# Patient Record
Sex: Male | Born: 2000 | Race: Black or African American | Hispanic: No | Marital: Single | Smoking: Current every day smoker
Health system: Southern US, Community
[De-identification: ages and names within clinical notes are randomized; demographics above are authoritative.]

## PROBLEM LIST (undated history)

## (undated) DIAGNOSIS — S8291XA Unspecified fracture of right lower leg, initial encounter for closed fracture: Secondary | ICD-10-CM

## (undated) DIAGNOSIS — S8292XA Unspecified fracture of left lower leg, initial encounter for closed fracture: Secondary | ICD-10-CM

## (undated) DIAGNOSIS — F909 Attention-deficit hyperactivity disorder, unspecified type: Secondary | ICD-10-CM

---
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
## 2022-06-19 ENCOUNTER — Other Ambulatory Visit: Payer: Self-pay

## 2022-06-19 ENCOUNTER — Emergency Department (HOSPITAL_COMMUNITY)
Admission: EM | Admit: 2022-06-19 | Discharge: 2022-06-20 | Disposition: A | Discharge status: Home / Self Care | Payer: Medicaid Other | Attending: Emergency Medicine | Admitting: Emergency Medicine

## 2022-06-19 DIAGNOSIS — R109 Unspecified abdominal pain: Secondary | ICD-10-CM | POA: Insufficient documentation

## 2022-06-19 DIAGNOSIS — R45851 Suicidal ideations: Secondary | ICD-10-CM

## 2022-06-19 DIAGNOSIS — Z1152 Encounter for screening for COVID-19: Secondary | ICD-10-CM | POA: Diagnosis not present

## 2022-06-19 DIAGNOSIS — F909 Attention-deficit hyperactivity disorder, unspecified type: Secondary | ICD-10-CM | POA: Diagnosis not present

## 2022-06-19 DIAGNOSIS — F4323 Adjustment disorder with mixed anxiety and depressed mood: Secondary | ICD-10-CM | POA: Insufficient documentation

## 2022-06-19 LAB — CBC WITH DIFFERENTIAL/PLATELET
Abs Immature Granulocytes: 0.02 10*3/uL (ref 0.00–0.07)
Basophils Absolute: 0 10*3/uL (ref 0.0–0.1)
Basophils Relative: 1 %
Eosinophils Absolute: 0.2 10*3/uL (ref 0.0–0.5)
Eosinophils Relative: 3 %
HCT: 40.4 % (ref 39.0–52.0)
Hemoglobin: 13.3 g/dL (ref 13.0–17.0)
Immature Granulocytes: 0 %
Lymphocytes Relative: 39 %
Lymphs Abs: 2.3 10*3/uL (ref 0.7–4.0)
MCH: 30.4 pg (ref 26.0–34.0)
MCHC: 32.9 g/dL (ref 30.0–36.0)
MCV: 92.4 fL (ref 80.0–100.0)
Monocytes Absolute: 0.5 10*3/uL (ref 0.1–1.0)
Monocytes Relative: 8 %
Neutro Abs: 3 10*3/uL (ref 1.7–7.7)
Neutrophils Relative %: 49 %
Platelets: 248 10*3/uL (ref 150–400)
RBC: 4.37 MIL/uL (ref 4.22–5.81)
RDW: 12.1 % (ref 11.5–15.5)
WBC: 6 10*3/uL (ref 4.0–10.5)
nRBC: 0 % (ref 0.0–0.2)

## 2022-06-19 LAB — RAPID URINE DRUG SCREEN, HOSP PERFORMED
Amphetamines: NOT DETECTED
Barbiturates: NOT DETECTED
Benzodiazepines: NOT DETECTED
Cocaine: NOT DETECTED
Opiates: NOT DETECTED
Tetrahydrocannabinol: POSITIVE — AB

## 2022-06-19 LAB — ETHANOL: Alcohol, Ethyl (B): <10 mg/dL (ref <10)

## 2022-06-19 LAB — RESP PANEL BY RT-PCR (RSV, FLU A&B, COVID)  RVPGX2
Influenza A by PCR: NEGATIVE
Influenza B by PCR: NEGATIVE
Resp Syncytial Virus by PCR: NEGATIVE
SARS Coronavirus 2 by RT PCR: NEGATIVE

## 2022-06-19 LAB — COMPREHENSIVE METABOLIC PANEL
ALT: 12 U/L (ref 0–44)
AST: 19 U/L (ref 15–41)
Albumin: 4.1 g/dL (ref 3.5–5.0)
Alkaline Phosphatase: 89 U/L (ref 38–126)
Anion gap: 7 (ref 5–15)
BUN: 6 mg/dL (ref 6–20)
CO2: 28 mmol/L (ref 22–32)
Calcium: 9.5 mg/dL (ref 8.9–10.3)
Chloride: 102 mmol/L (ref 98–111)
Creatinine, Ser: 0.75 mg/dL (ref 0.61–1.24)
GFR, Estimated: >60 mL/min (ref >60)
Glucose, Bld: 101 mg/dL — ABNORMAL HIGH (ref 70–99)
Potassium: 3.5 mmol/L (ref 3.5–5.1)
Sodium: 137 mmol/L (ref 135–145)
Total Bilirubin: 1.3 mg/dL — ABNORMAL HIGH (ref 0.3–1.2)
Total Protein: 6.9 g/dL (ref 6.5–8.1)

## 2022-06-19 LAB — LACTIC ACID, PLASMA: Lactic Acid, Venous: 0.9 mmol/L (ref 0.5–1.9)

## 2022-06-19 MED ORDER — ACETAMINOPHEN 325 MG PO TABS: 650.0000 mg | ORAL_TABLET | ORAL | Status: DC | PRN

## 2022-06-19 MED ORDER — ZOLPIDEM TARTRATE 5 MG PO TABS: 5.0000 mg | ORAL_TABLET | Freq: Every evening | ORAL | Status: DC | PRN

## 2022-06-19 MED ORDER — ALUM & MAG HYDROXIDE-SIMETH 200-200-20 MG/5ML PO SUSP: 30.0000 mL | Freq: Four times a day (QID) | ORAL | Status: DC | PRN

## 2022-06-19 MED ORDER — NICOTINE 21 MG/24HR TD PT24
21.0000 mg | MEDICATED_PATCH | Freq: Every day | TRANSDERMAL | Status: DC
  Filled 2022-06-19: qty 1

## 2022-06-19 MED ORDER — ONDANSETRON HCL 4 MG PO TABS: 4.0000 mg | ORAL_TABLET | Freq: Three times a day (TID) | ORAL | Status: DC | PRN

## 2022-06-19 NOTE — ED Triage Notes (Signed)
Pt bib ems with reports of congestion, headache, body aches.

## 2022-06-19 NOTE — ED Provider Triage Note (Signed)
Emergency Medicine Provider Triage Evaluation Note  Robert Silva , a 22 y.o. male  was evaluated in triage.  Pt complains of headache, body aches, runny nose, sore throat. Reportedly febrile at home with decreased level of consciousness. Periumbilical abdominal discomfort. Nausea with vomiting, soft stools. Patient endorsed to nursing staff that he was suicidal, would attempt to hurt himself  Review of Systems  Positive: Abdominal pain, URI symptoms, fever Negative: Neck pain  Physical Exam  BP 139/64   Pulse 62   Temp 99 F (37.2 C) (Oral)   Resp 16   SpO2 98%  Gen:   Awake, no distress   Resp:  Normal effort  MSK:   Moves extremities without difficulty  Other:  Diffuse abdominal discomfort  Medical Decision Making  Medically screening exam initiated at 7:35 PM.  Appropriate orders placed.  Robert Silva was informed that the remainder of the evaluation will be completed by another provider, this initial triage assessment does not replace that evaluation, and the importance of remaining in the ED until their evaluation is complete.     Etta Quill, NP 06/19/22 1947
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
## 2022-06-19 NOTE — ED Notes (Signed)
During triage pt expressed SI and states that he recently lost a mother figure and that the father of the love of his life hates him and that his biological mother tells him he was a mistake. Pt states multiple plans to end life including jumping off a a bridge or slitting his wrists.

## 2022-06-20 ENCOUNTER — Other Ambulatory Visit: Payer: Self-pay

## 2022-06-20 ENCOUNTER — Encounter (HOSPITAL_COMMUNITY): Payer: Self-pay

## 2022-06-20 DIAGNOSIS — F4323 Adjustment disorder with mixed anxiety and depressed mood: Secondary | ICD-10-CM

## 2022-06-20 DIAGNOSIS — F909 Attention-deficit hyperactivity disorder, unspecified type: Secondary | ICD-10-CM

## 2022-06-20 NOTE — ED Notes (Signed)
Resting quietly. No complaints.
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
## 2022-06-20 NOTE — ED Notes (Signed)
Tolerated lunch tray. No complaints. Awaiting discharge.

## 2022-06-20 NOTE — ED Notes (Signed)
Pt is not ivc for now voluntary form attached to the clipboard in orange zone

## 2022-06-20 NOTE — ED Notes (Signed)
Pt resting with eyes closed; respirations spontaneous, even, unlabored 

## 2022-06-20 NOTE — ED Notes (Signed)
Pt ambulatory to and from restroom with steady gait 

## 2022-06-20 NOTE — Discharge Instructions (Signed)
[facility]  [address] [place], [place] [address] [phone]

## 2022-06-20 NOTE — ED Notes (Signed)
Cooperative. Denies S.I. No complaints. Ambulatory with steady gait. Independent with ADL.

## 2022-06-21 ENCOUNTER — Encounter (HOSPITAL_COMMUNITY): Payer: Self-pay | Admitting: Registered Nurse

## 2022-06-21 ENCOUNTER — Ambulatory Visit (HOSPITAL_COMMUNITY)
Admission: EM | Admit: 2022-06-21 | Discharge: 2022-06-21 | Disposition: A | Discharge status: Home / Self Care | Payer: Medicaid Other | Attending: Registered Nurse | Admitting: Registered Nurse

## 2022-06-21 DIAGNOSIS — Z599 Problem related to housing and economic circumstances, unspecified: Secondary | ICD-10-CM

## 2022-06-21 DIAGNOSIS — Z5900 Homelessness unspecified: Secondary | ICD-10-CM

## 2022-06-21 DIAGNOSIS — F4323 Adjustment disorder with mixed anxiety and depressed mood: Secondary | ICD-10-CM | POA: Insufficient documentation

## 2022-06-21 NOTE — Discharge Instructions (Addendum)
[facility]: Outpatient psychiatric Services  New Patient Assessment and Therapy Walk-in Monday thru Thursday 8:00 am first come first serve until slots are full Every Friday from 1:00 pm to 4:00 pm first come first serve until slots are full  New Patient Psychiatric Medication Management Monday thru Friday from 8:00 am to 11:00 am first come first served until slots are full  For all walk-ins we ask that you arrive by 7:15 am because patients will be seen in there order of arrival.   Availability is limited, and therefore you may not be seen on the same day that you walk in.  Our goal is to serve and meet the needs of our community to the best of our ability.   [facility] Address: [address] [place], [place], [place] [address] Phone: [phone]  Supported Employment The supported employment program is a person-centered, individualized, evidence-based support service that helps members choose, acquire, and maintain competitive employment in our community. This service supports the varying needs of individuals and promotes community inclusion and employment success. Members enrolled in the supported employment program can expect the following:  Development of an individual career plan Community based job placement Job shadowing Job development On-site job Furniture conservator/restorer and support  Supported Education Supported education helps our members receive the education and training they need to achieve their learning and recovery goals. This will assist members with becoming gainfully employed in the job or career of their choice. The program includes assistance with: Registering for disability accommodations Enrolling in school and registering for classes Learning communication skills Scheduling tutoring sessions within your school [facility] partners with [employer] to help increase the success of clients seeking employment and  educational goals.  Want to learn more about our programs?   Please contact our intake department INTAKE: [phone] Ext [phone]  Mailing: [address] [address]   [place], [place] [address]   [email or web address]   Suicide Resources  Who to Call Call 911 National Suicide Prevention Hotline - Dial 988   [facility] at (424)446-5283; 847-421-2098  More Resources Suicide Awareness Voices of Education       [phone]        www.save.org [employer] on Mental Illness(NAMI)       (800) 950-NAMI        www.nami.org American Association of Suicidology       212-571-7463        www.suicidology.org  Substance Abuse Treatment Programs  Intensive Outpatient Programs [facility]     [address]      [place], [place]                   [phone]       [facility] [address] [place] [address] [place], [place] [phone]  [facility]     (Inpatient and outpatient)     [address]           [phone]    [facility] [phone] (Suboxone and Methadone)  [address]      [place], [place] [address]      [phone]       [address] [place], [place] [phone]  [facility] (Outpatient/Inpatient, Chemical)    (insurance only) [phone]             [facility] (Groups & Residential) [place], [place] [phone]  [facility]     [address]     [place], [place]      [phone]       [facility] (for caregivers and family) [phone] [address] [place], [place] [phone]      Residential Treatment Programs [facility]      [address] [place], [place] [address]  [phone]       [facility] [address] [place], [place] [address] [phone]  [facility] [place]        [phone]       [facility] [phone]  [facility] (Addiction Recovery Care [facility]             [address]                                         [place], [place]                                                [phone] or [phone]                               [facility] [address] [place], [address] [phone]  [facility]    [address]      [place], [place]     [phone]       [facility] [address] [place], [place] [phone]  [facility]   [address] [place], [place] [address]     [phone]      Admissions: 8am-3pm M-F  [facility] (RTS) [address] [place], [place] [phone]  [facility]: Residential Program [phone] Days)   [place], [place]      [phone] or [phone]     [facility]: [facility] [place], [place] (Walk in Hours over the weekend or by referral)  [facility] [address] [place], [place], [place] [address] [phone]  Crisis Mobile: [facility]:  [phone] (for crisis response 24 hours a day) [facility] Hotline:      628-666-3827 Outpatient Psychiatry and Counseling  [facility]: Mobile Crisis Management 24 hours:  [phone]  [facility] [employer] sliding scale fee and walk in schedule: M-F 8am-12pm/1pm-3pm [address]  [place], [place] [address] [phone]  [facility] [address] [place], [place] [address] [phone]  [facility] (Former[facility] [employer])- new patient walk-in appointments available Monday - Friday 8am -3pm.          [address] [place], [place] [address] [phone] or crisis line- [phone]  [facility] Services/ Intensive Outpatient Therapy Program [address] [place], [place] [address] [phone]  [facility]                  Crisis Services      [phone] [address]     [place], [place] [address]                 [facility]   [facility] [phone]. [place]  [address] [place], [place] [address]   [facility]          [address] [place]  [place], [place] [address]       [phone]  [facility] [address] [place], [place] [address] [phone]  [facility]    [address]    [place], [place] [address]     [phone]       Andee Poles, MD     [address]     [place] [address]     [phone]       [facility] [address] [place] [address]  [facility]     [address] [place], [place]      [phone]       [facility] Eulogio Ditch, MD [address] [place], [place] [address] [phone]  [facility]     [address]     [place], [place] [address]     [phone]       [facility] [address] [place], [place] [address] [phone] Resources for Temporary Residential Assistance/Crisis Centers  DAY CENTERS [facility]) M-F 8am-3pm   [address] [place], [place] [address]   [phone] Services include: laundry, barbering, support groups, case management, phone  & computer access, showers, AA/NA mtgs, mental health/substance abuse nurse, job skills class, disability information, VA assistance, spiritual classes, etc.   HOMELESS SHELTERS  [facility]     [facility]   [address], [place]     [phone]              [employer] (women and children)       [address] [place], [place] [address] [phone] [email or web address] for application and process Application Required  [facility]   [address]    [place] [address]     [phone]                    [facility] [address] [place], [place] [address] [phone] application appt.) Application Required  [facility] (women only)    [address]     [place], [place] [address]     [phone]      Intake starts 6pm daily Need valid ID, SSC, & Police report [employer] [address] [place], [place] [phone] Application Required  [employer] (men only)     [address]      [place], [place]     [phone]       [facility] [place] (Pregnant women only) [address] [place], [place] [phone]  [facility]      [address]      [place], [place] [address]     [phone]             [facility] [address] [place], [place] [phone] 90 day commitment/SA/Application process  [facility](men only)     [address]     [place], [place]     [phone]       Check-in at [facility] of [place]  [place], [place] [address] [phone] Men/Women/Women and Children must be there by 7 pm  [place], [place]                          Care Coordination What is Care Coordination? Care Coordination is a part of [facility]'s managed care system. It is designed to ensure the best of care for people with serious treatment needs.  When consumers' needs put them in high-risk situations (such as relapse, hospitalization, homelessness, incarceration and/or transition between services), Care Coordinators take special care to link them with the specialty services they need.  Care Coordination is focused on helping individuals across the entire healthcare system. Care Coordinators manage consumers' care from assessment to various treatments and services.  By working with the individual and their providers, Care Coordinators improve results by effectively  utilizing resources.  What Are Special Needs Populations?  Each disability area has a Special Needs Population that is considered to be at high-risk and is served through [facility]. Intellectual Developmental Disabilities (I/DD): People enrolled in the [place] who receive B3 Deinstitutionalization (DI) funding People with I/DD who are functionally eligible for ICF-MR level of care but are not enrolled in [facility] [employer], the B3 DI funding or an ICF-MR facility. People with I/DD who are currently in, or have been within the past 30 days, a facility operated by the Department of Corrections [facility]) or the Department of [place] and Delinquency (DJJDP) for whom the [facility] has received notification of discharge.  Mental Health/Substance Abuse (MH/SA): Adults with severe and persistent mental illness and current LOCUS (Level of Care Utilization System) Level of VI Children with Severe Emotional Disturbance or current CALOCUS (Child and Adolescent Level of Care Utilization System) level of IV or are currently in, or have been within the past 30 days, a facility operated by the Department of Corrections [facility]) or the Department of Juvenile Justice and Delinquency (DJJDP) for whom the [facility] has received notification of discharge. Individuals that have Substance Use/Addiction Dependence diagnosis and current ASAM Level of lll.7 or ll.2D or higher. Individuals with an opioid dependence diagnosis and who have been reported to have used drugs by injection within the past 30 days. Individuals with both a mental illness diagnosis and a substance abuse/addiction diagnosis and current LOCUS/CALOCUS of V or higher OR current ASAM PPC Level of lll.5 or higher Individuals with both a mental illness diagnosis and a developmental disability diagnosis and current LOCUS/CALOCUS of IV or higher Individuals with both a developmental disability diagnosis and a substance use/addiction  diagnosis and current ASAM PPC Level of lll.3 or higher  What do Care Coordinators Do?  Care Coordination includes the following: Identification of Special Needs Population individuals Ensuring that a Person Centered Plan is available for all individuals with special needs For individuals in identified MH/SA special health populations, ensuring that a Person Centered Plan is completed by the [place] engaging individuals identified as a member of a special needs population Identifying the gaps in needed services and intervening to ensure the person receives appropriate care Coordinating services for the individual across the system and with other systems of care, including primary care Measuring results of intervention and treatment, including reduction in high-risk events and inappropriate service utilization Collaboration with [place] to assure that consumers medical care is considered in accordance with CCNC/LME/MCO protocols  What can you expect in the  [employer] Plan of Care process? During the planning process, your Care Coordinator will explain the different services to you and work with you to develop your Plan of Care based on the services you wish to request. Your Care Coordinator will also explain the requirements in the [employer] Waiver around those services. Your Care Coordinator will assure that your Plan of Care will include the services that you want to request, for the length of time that you want to request them. Your Plan of Care should be used to plan for the entire year, and services that you expect to need at any point during that year. If you expect to need services for the entire year, your Care Coordinator will assure that the plan requests those services for the entire year. You must have a signed Plan of Care in order to receive services through the [employer] Waiver. That means that you need to sign a Plan containing  the level of services that you want to request, which may be different than the level of services that will be approved. Your Care Coordinator will draft the Plan of Care based on your wishes, will review the plan with you before you sign it, will answer any questions you have, and will make any changes to the plan that you request before you are asked to sign it. If you wish to change or add services during the plan year, you may ask your Care Coordinator to help you request the change by writing an update to your Plan of Care at any time. You (or your legally responsible representative) will need to sign the Plan of Care once it is complete. You will not be asked to sign a plan that does not contain the level of services that you want to request. If you expect to need those services all year, you will not be asked to sign a plan that does not request those services for the entire plan year. The Utilization Management Department of [facility] will determine whether or not the services you request are medically necessary, not your Care Coordinator. A decision on your request for services in your Pan of Care will be made within 15 days unless more information is needed. If any service requested in your Plan of Care is not fully approved (for example, a service is denied or is approved for fewer hours or for a length of time that is less than what you requested), you will receive a written explanation of that decision and information about how you can appeal. [facility] will not retaliate against you in any way if you appeal. Your Care Coordinator can assist you with the forms needed to file an appeal. If some services are approved and some are denied, you can receive the services that were approved while you appeal the services that were denied. You may also make a new request for different services while your appeal is pending, if you wish to do so. Your Plan of Care will include information on  the period of time for which services are requested. If services that have been requested in your Plan have been approved and then are later reduced, suspended, or terminated before the approval period has ended, and you appeal that decision, you may be able to continue to receive services during an appeal. You will receive written notice about that process before any services are reduced, suspended or terminated.  How to Seek Help Call [phone] any time, day or night, to speak with  a licensed behavioral health clinician for information or to schedule an assessment appointment regarding mental health, intellectual/developmental disabilities or substance use disorders. Get emergency and non-emergency assistance, answers to your questions and/or an appointment with a provider in [place].  When needed, interpreter services will be arranged through our Call Center or through your provider at no cost to you.  For people who have hearing impairments and/or speech disabilities, please call [phone], or [phone].  Si alguien est experimentando una crisis de salud conductual, comunquese con [place] de crisis de salud conductual sin cargo al [phone]. Si una persona parece estar en peligro inminente para s misma, para usted o para Producer, television/film/video, llame de inmediato al 911.

## 2022-06-21 NOTE — ED Provider Notes (Cosign Needed Addendum)
Behavioral Health Urgent Care Medical Screening Exam  Patient Name: Robert Silva MRN: [id number] Date of Evaluation: 06/21/22 Chief Complaint:   Psychiatric services, community resources Diagnosis:  Final diagnoses:  Homelessness  Adjustment disorder with mixed anxiety and depressed mood  Inadequate community resources    History of Present illness: Robert Silva is a 22 y.o. male patient presented to [facility] as a walk in via [place] requesting outpatient psychiatric services.    Robert Silva, 22 y.o., male patient seen face to face by this provider, consulted with Dr. Hampton Abbot; and chart reviewed on 06/21/22.  On evaluation Robert Silva reports he came to get set up with outpatient psychiatric services.  Patient states he is currently homeless and has been staying at the [facility] because of the white flag nights and sometimes he has a friend that "will let me stay with him.  He said I could stay with him tonight, but I got to meet him at the [facility]).  Patient endorses depression related to his homelessness, unemployment, not getting along with the mother of one of his children."  States he has plans to ask his second baby mama to marry him as soon as he gets his self together.  Patient denies active suicidal ideation, self-harm and homicidal ideation.  Patient also denies psychosis and paranoia.  Reports he was discharged from the emergency room yesterday and referred to come her for psychiatric services.  Informed he needed to come during the day hours.  Understanding voiced.   During evaluation:  Robert Silva is sitting in chair with no noted distress.  He is dressed appropriate for weather.  He is alert/oriented x 4, calm, cooperative, pleasant, attentive, and responses are relevant to assessment questions.  His mood is euthymic but anxious with congruent affect.  He is speaking in a clear tone at moderate volume, and normal pace, with good eye contact.  He  denies suicidal/self-harm/homicidal ideation, psychosis, and paranoia.  Objectively:  there is no evidence of psychosis/mania or delusional thinking.  He is able to converse coherently, with goal directed thoughts, and no distractibility, or pre-occupation.    At this time Robert Silva is educated and verbalizes understanding of mental health resources and other crisis services in the community. He is instructed to call 911 and present to the nearest emergency room should he experience any suicidal/homicidal ideation, auditory/visual/hallucinations, or detrimental worsening of his mental health condition.  He was a also advised by Probation officer that he could call the toll-free phone on back of  insurance card to assist with identifying counselors and agencies in network number on back of Medicaid card to speak with care coordinator.    [facility] from 06/21/2022 in [facility] ED from 06/19/2022 in [facility] Emergency Department at [facility] ED from 10/17/2021 in [facility] Emergency Department at [place] CATEGORY Moderate Risk High Risk No Risk       Psychiatric Specialty Exam  Presentation  General Appearance:Appropriate for Environment  Eye Contact:Good  Speech:Clear and Coherent; Normal Rate  Speech Volume:Normal  Handedness:Right   Mood and Affect  Mood: Euthymic  Affect: Appropriate; Congruent   Thought Process  Thought Processes: Coherent; Goal Directed  Descriptions of Associations:Intact  Orientation:Full (Time, Place and Person)  Thought Content:Logical    Hallucinations:None  Ideas of Reference:None  Suicidal Thoughts:No Without Intent; Without Plan  Homicidal Thoughts:No   Sensorium  Memory: Immediate Good; Recent Good  Judgment:  Intact  Insight: Present; Fair   Community education officer  Concentration: Fair  Attention Span: Fair  Recall: Robert Silva of  Knowledge: Good  Language: Good   Psychomotor Activity  Psychomotor Activity: Normal   Assets  Assets: Communication Skills; Desire for Improvement; Physical Health; Social Support   Sleep  Sleep: Fair  Number of hours:  6   Physical Exam: Physical Exam Vitals and nursing note reviewed.  Constitutional:      General: He is not in acute distress.    Appearance: Normal appearance. He is not ill-appearing.  HENT:     Head: Normocephalic.  Eyes:     Conjunctiva/sclera: Conjunctivae normal.  Cardiovascular:     Rate and Rhythm: Normal rate.  Pulmonary:     Effort: Pulmonary effort is normal.  Musculoskeletal:        General: Normal range of motion.     Cervical back: Normal range of motion.  Skin:    General: Skin is warm and dry.  Neurological:     Mental Status: He is alert and oriented to person, place, and time.  Psychiatric:        Attention and Perception: Attention and perception normal. He does not perceive auditory or visual hallucinations.        Mood and Affect: Mood and affect normal.        Behavior: Behavior normal. Behavior is cooperative.        Thought Content: Thought content normal. Thought content is not paranoid or delusional. Thought content does not include homicidal or suicidal ideation.        Judgment: Judgment is impulsive.    Review of Systems  Constitutional: Negative.   HENT: Negative.    Eyes: Negative.   Respiratory: Negative.    Cardiovascular: Negative.   Gastrointestinal: Negative.   Genitourinary: Negative.   Musculoskeletal: Negative.   Skin: Negative.   Neurological: Negative.   Endo/Heme/Allergies: Negative.   Psychiatric/Behavioral:  Depression: Stable. Hallucinations: Denies. Suicidal ideas: Denies. Nervous/anxious: Stable.    Blood pressure (!) 143/82, pulse 80, temperature 98.6 F (37 C), temperature source Oral, resp. rate 18, SpO2 100 %. There is no height or weight on file to calculate  BMI.  Musculoskeletal: Strength & Muscle Tone: within normal limits Gait & Station: normal Patient leans: N/A   [place] MSE Discharge Disposition for Follow up and Recommendations: Based on my evaluation the patient does not appear to have an emergency medical condition and can be discharged with resources and follow up care in outpatient services for Medication Management and Individual Therapy   Nikeya Maxim, NP 06/21/2022, 7:33 PM
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
## 2022-07-02 ENCOUNTER — Emergency Department (HOSPITAL_COMMUNITY)
Admission: EM | Admit: 2022-07-02 | Discharge: 2022-07-02 | Disposition: A | Discharge status: Home / Self Care | Payer: Medicaid Other | Attending: Emergency Medicine | Admitting: Emergency Medicine

## 2022-07-02 ENCOUNTER — Other Ambulatory Visit: Payer: Self-pay

## 2022-07-02 DIAGNOSIS — J039 Acute tonsillitis, unspecified: Secondary | ICD-10-CM | POA: Diagnosis not present

## 2022-07-02 DIAGNOSIS — R590 Localized enlarged lymph nodes: Secondary | ICD-10-CM | POA: Insufficient documentation

## 2022-07-02 DIAGNOSIS — J029 Acute pharyngitis, unspecified: Secondary | ICD-10-CM | POA: Diagnosis present

## 2022-07-02 MED ORDER — AMOXICILLIN 500 MG PO CAPS
500.0000 mg | ORAL_CAPSULE | Freq: Once | ORAL | Status: AC
  Administered 2022-07-02: 500 mg via ORAL
  Filled 2022-07-02: qty 1

## 2022-07-02 MED ORDER — AMOXICILLIN 500 MG PO CAPS: 500.0000 mg | ORAL_CAPSULE | Freq: Three times a day (TID) | ORAL | 0 refills | Status: AC

## 2022-07-02 NOTE — ED Triage Notes (Signed)
Pt arrives to ED c/o throat pain x 7 days, pt endorses that children have strep throat.

## 2022-07-02 NOTE — Discharge Instructions (Addendum)
Gargle warm salt water, drink warm tea with honey.  Take antibiotics as prescribed and complete the full course. Recheck with your doctor if not improving in the next 2 to 3 days.

## 2022-07-02 NOTE — ED Provider Notes (Signed)
[facility] Provider Note   CSN: [id number] Arrival date & time: 07/02/22  2315     History {Add pertinent medical, surgical, social history, OB history to HPI:1} Chief Complaint  Patient presents with  . Sore Throat    Robert Silva is a 22 y.o. male.  22 year old male brought in by EMS with complaint of sore throat x 1 week.  Patient has not take anything for his symptoms.  Was exposed to his son who has tested positive for strep.  He denies cough, congestion, body aches or fevers.      Home Medications Prior to Admission medications   Medication Sig Start Date End Date Taking? Authorizing Provider  amoxicillin (AMOXIL) 500 MG capsule Take 1 capsule (500 mg total) by mouth 3 (three) times daily for 10 days. 07/02/22 07/12/22 Yes Tacy Learn, PA-C      Allergies    Fava beans and Sulfa antibiotics    Review of Systems   Review of Systems Negative except as per HPI Physical Exam Updated Vital Signs BP 132/76   Pulse 73   Temp 98.8 F (37.1 C)   Resp 17   SpO2 98%  Physical Exam Vitals and nursing note reviewed.  Constitutional:      General: He is not in acute distress.    Appearance: He is well-developed. He is not diaphoretic.  HENT:     Head: Normocephalic and atraumatic.     Nose: No congestion.     Mouth/Throat:     Mouth: Mucous membranes are moist.     Pharynx: Oropharynx is clear. Uvula midline. No pharyngeal swelling, oropharyngeal exudate, posterior oropharyngeal erythema or uvula swelling.     Tonsils: Tonsillar exudate present. No tonsillar abscesses. 1+ on the right. 1+ on the left.  Eyes:     Conjunctiva/sclera: Conjunctivae normal.  Cardiovascular:     Rate and Rhythm: Normal rate and regular rhythm.     Heart sounds: Normal heart sounds.  Pulmonary:     Effort: Pulmonary effort is normal.     Breath sounds: Normal breath sounds.  Lymphadenopathy:     Cervical: Cervical adenopathy present.   Skin:    General: Skin is warm and dry.  Neurological:     Mental Status: He is alert and oriented to person, place, and time.  Psychiatric:        Behavior: Behavior normal.    ED Results / Procedures / Treatments   Labs (all labs ordered are listed, but only abnormal results are displayed) Labs Reviewed - No data to display  EKG None  Radiology No results found.  Procedures Procedures  {Document cardiac monitor, telemetry assessment procedure when appropriate:1}  Medications Ordered in ED Medications  amoxicillin (AMOXIL) capsule 500 mg (has no administration in time range)    ED Course/ Medical Decision Making/ A&P   {   Click here for ABCD2, HEART and other calculatorsREFRESH Note before signing :1}                          Medical Decision Making  22 year old male with complaint of sore throat x 1 week after exposure to his child who has tested positive for strep.  Found to have enlarged tonsils with light exudate and tender cervical adenopathy.  Uvula is midline, do not suspect tonsillar abscess.  Lungs clear to auscultation.  Patient is provided with first dose of amoxicillin, prescription for same sent to  his pharmacy.  {Document critical care time when appropriate:1} {Document review of labs and clinical decision tools ie heart score, Chads2Vasc2 etc:1}  {Document your independent review of radiology images, and any outside records:1} {Document your discussion with family members, caretakers, and with consultants:1} {Document social determinants of health affecting pt's care:1} {Document your decision making why or why not admission, treatments were needed:1} Final Clinical Impression(s) / ED Diagnoses Final diagnoses:  Tonsillitis    Rx / DC Orders ED Discharge Orders          Ordered    amoxicillin (AMOXIL) 500 MG capsule  3 times daily        07/02/22 2321

## 2022-08-06 ENCOUNTER — Encounter: Payer: Self-pay | Admitting: Pediatric Intensive Care

## 2022-08-06 NOTE — Progress Notes (Signed)
No needs at this point. Lisette Abu BSN RN CCNP [phone]

## 2022-08-06 NOTE — Progress Notes (Signed)
This patient was identified as a resident of [facility]. Resources provided

## 2022-08-12 ENCOUNTER — Encounter: Payer: Self-pay | Admitting: *Deleted

## 2022-08-12 NOTE — Progress Notes (Signed)
Pt attended the 08/06/22 screening event during which his b/p was within normal limits. Per the event RN and CHW, th pt identified as a resident of [place]. Resources provided for identified SDOH barriers and pt has access to [facility] PCP and CCNP if needed. No additional health equity team support indicated at this time.

## 2022-09-06 ENCOUNTER — Emergency Department (HOSPITAL_COMMUNITY): Payer: Medicaid Other

## 2022-09-06 ENCOUNTER — Emergency Department (HOSPITAL_COMMUNITY)
Admission: EM | Admit: 2022-09-06 | Discharge: 2022-09-06 | Disposition: A | Discharge status: Home / Self Care | Payer: Medicaid Other | Attending: Emergency Medicine | Admitting: Emergency Medicine

## 2022-09-06 ENCOUNTER — Other Ambulatory Visit: Payer: Self-pay

## 2022-09-06 ENCOUNTER — Encounter (HOSPITAL_COMMUNITY): Payer: Self-pay

## 2022-09-06 DIAGNOSIS — W25XXXA Contact with sharp glass, initial encounter: Secondary | ICD-10-CM | POA: Insufficient documentation

## 2022-09-06 DIAGNOSIS — S60413A Abrasion of left middle finger, initial encounter: Secondary | ICD-10-CM | POA: Insufficient documentation

## 2022-09-06 DIAGNOSIS — M79642 Pain in left hand: Secondary | ICD-10-CM | POA: Diagnosis present

## 2022-09-06 MED ORDER — OXYCODONE-ACETAMINOPHEN 5-325 MG PO TABS
1.0000 | ORAL_TABLET | Freq: Once | ORAL | Status: AC
  Administered 2022-09-06: 1 via ORAL
  Filled 2022-09-06: qty 1
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
## 2022-09-06 NOTE — Discharge Instructions (Signed)
Imaging negative for evidence of fracture, likely muscular pain, recommend over-the-counter pain medication as needed, keep it elevated, applying ice to the area.  If Symptoms not improving after a week's time follow-up with hand surgery and/or community health and wellness for further evaluation  Come back to the emergency department if you develop chest pain, shortness of breath, severe abdominal pain, uncontrolled nausea, vomiting, diarrhea.

## 2022-09-14 ENCOUNTER — Other Ambulatory Visit: Payer: Self-pay

## 2022-09-14 ENCOUNTER — Emergency Department (HOSPITAL_COMMUNITY): Payer: Medicaid Other

## 2022-09-14 ENCOUNTER — Encounter (HOSPITAL_COMMUNITY): Payer: Self-pay | Admitting: Emergency Medicine

## 2022-09-14 ENCOUNTER — Emergency Department (HOSPITAL_COMMUNITY)
Admission: EM | Admit: 2022-09-14 | Discharge: 2022-09-14 | Disposition: A | Discharge status: Home / Self Care | Payer: Medicaid Other | Attending: Emergency Medicine | Admitting: Emergency Medicine

## 2022-09-14 DIAGNOSIS — S62232A Other displaced fracture of base of first metacarpal bone, left hand, initial encounter for closed fracture: Secondary | ICD-10-CM | POA: Insufficient documentation

## 2022-09-14 DIAGNOSIS — S60932A Unspecified superficial injury of left thumb, initial encounter: Secondary | ICD-10-CM | POA: Diagnosis present

## 2022-09-14 DIAGNOSIS — W500XXA Accidental hit or strike by another person, initial encounter: Secondary | ICD-10-CM | POA: Diagnosis not present

## 2022-09-14 MED ORDER — IBUPROFEN 800 MG PO TABS
800.0000 mg | ORAL_TABLET | Freq: Once | ORAL | Status: AC
  Administered 2022-09-14: 800 mg via ORAL
  Filled 2022-09-14: qty 1

## 2022-09-14 NOTE — Progress Notes (Signed)
Orthopedic Tech Progress Note Patient Details:  Robert Silva 30-Oct-2000 [id number]  Ortho Devices Type of Ortho Device: Thumb spica splint Splint Material: Fiberglass Ortho Device/Splint Location: LUE Ortho Device/Splint Interventions: Application   Post Interventions Patient Tolerated: Well  [name] 09/14/2022, 5:07 PM

## 2022-09-14 NOTE — ED Notes (Signed)
Thumb spica applied by ortho tech.

## 2022-09-14 NOTE — ED Notes (Signed)
Patient verbalizes understanding of discharge instructions. Opportunity for questioning and answers were provided. Armband removed by staff, pt discharged from ED. Pt taken to ED waiting room via wheel chair.  
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
## 2022-09-14 NOTE — Discharge Instructions (Addendum)
You are seen today in the emergency department due to thumb injury.  You have a fracture to your left thumb, wear the thumb spica splint.  You will need to follow-up with hand surgery, they should call you to schedule follow-up and likely surgical intervention.  Take Tylenol Motrin as needed for pain.  Return to the ED for new or concerning symptoms.                                    COMPARISON:  None Available.    FINDINGS:  Mildly displaced and minimally comminuted thumb metacarpal fracture  involving the proximal shaft. There is minimal apex radial  angulation. No intra-articular involvement. No additional fracture  of the hand. Incidental note of mild ulna minus variance. There is  mild soft tissue edema at the fracture site.    IMPRESSION:  Mildly displaced and minimally comminuted thumb metacarpal fracture  involving the proximal shaft. No intra-articular involvement.

## 2022-09-16 ENCOUNTER — Encounter (HOSPITAL_COMMUNITY): Payer: Self-pay

## 2023-01-17 ENCOUNTER — Emergency Department (HOSPITAL_COMMUNITY)
Admission: EM | Admit: 2023-01-17 | Discharge: 2023-01-17 | Discharge status: Against Medical Advice | Payer: Medicaid Other | Source: Home / Self Care
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
## 2023-06-22 NOTE — Congregational Nurse Program (Signed)
Client came to RN for spiritual care and talk about things in his life. He has a plan to go to a [employer] school next semester and will be caring for his child full time for a month. He is very excited about these things. RN was able to let client talk and listened. RN was able to offer encouragement, support and prayer for client. He states he enjoys our spiritual care conversations and he is able to see positive change in his life as a result of our conversations.

## 2023-06-23 ENCOUNTER — Encounter (HOSPITAL_COMMUNITY): Payer: Self-pay | Admitting: Emergency Medicine

## 2023-06-23 ENCOUNTER — Emergency Department (HOSPITAL_COMMUNITY): Payer: Self-pay

## 2023-06-23 ENCOUNTER — Emergency Department (HOSPITAL_COMMUNITY)
Admission: EM | Admit: 2023-06-23 | Discharge: 2023-06-23 | Disposition: A | Discharge status: Home / Self Care | Payer: Self-pay | Attending: Student | Admitting: Student

## 2023-06-23 ENCOUNTER — Other Ambulatory Visit: Payer: Self-pay

## 2023-06-23 ENCOUNTER — Emergency Department (HOSPITAL_COMMUNITY)
Admission: EM | Admit: 2023-06-23 | Discharge: 2023-06-23 | Disposition: A | Discharge status: Home / Self Care | Payer: Self-pay | Attending: Emergency Medicine | Admitting: Emergency Medicine

## 2023-06-23 DIAGNOSIS — M79642 Pain in left hand: Secondary | ICD-10-CM | POA: Insufficient documentation

## 2023-06-23 DIAGNOSIS — J069 Acute upper respiratory infection, unspecified: Secondary | ICD-10-CM | POA: Insufficient documentation

## 2023-06-23 DIAGNOSIS — Z20822 Contact with and (suspected) exposure to covid-19: Secondary | ICD-10-CM | POA: Insufficient documentation

## 2023-06-23 DIAGNOSIS — S0083XA Contusion of other part of head, initial encounter: Secondary | ICD-10-CM | POA: Insufficient documentation

## 2023-06-23 LAB — RESP PANEL BY RT-PCR (RSV, FLU A&B, COVID)  RVPGX2
Influenza A by PCR: NEGATIVE
Influenza B by PCR: NEGATIVE
Resp Syncytial Virus by PCR: NEGATIVE
SARS Coronavirus 2 by RT PCR: NEGATIVE

## 2023-06-23 MED ORDER — IBUPROFEN 200 MG PO TABS
600.0000 mg | ORAL_TABLET | Freq: Once | ORAL | Status: AC
  Administered 2023-06-23: 600 mg via ORAL
  Filled 2023-06-23: qty 1

## 2023-06-23 MED ORDER — ACETAMINOPHEN 500 MG PO TABS
1000.0000 mg | ORAL_TABLET | Freq: Once | ORAL | Status: AC
  Administered 2023-06-23: 1000 mg via ORAL
  Filled 2023-06-23: qty 2

## 2023-06-23 MED ORDER — IBUPROFEN 400 MG PO TABS
600.0000 mg | ORAL_TABLET | Freq: Once | ORAL | Status: AC
  Administered 2023-06-23: 600 mg via ORAL
  Filled 2023-06-23: qty 1

## 2023-06-23 NOTE — ED Notes (Signed)
Bagged lunch provided upon discharge

## 2023-06-23 NOTE — Discharge Instructions (Signed)
You were seen in the ER today for your congestion. Your covid and flu tests were negative. Your physical exam was reassuring. You likely  have a different virus causing your symptoms. Follow up with the clinic below as needed and return to the ER with any new severe symptoms.
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
## 2023-06-23 NOTE — ED Triage Notes (Signed)
  Patient comes in with cough and nasal congestion that has been going on for about a week.  Denies any fevers.  Taking mucinex daily with last dose Thursday morning.  Endorsing headache.  Pain 4/10, aching.
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
## 2023-07-05 ENCOUNTER — Ambulatory Visit (INDEPENDENT_AMBULATORY_CARE_PROVIDER_SITE_OTHER): Payer: Medicaid Other | Admitting: Primary Care

## 2023-07-05 ENCOUNTER — Telehealth (INDEPENDENT_AMBULATORY_CARE_PROVIDER_SITE_OTHER): Payer: Self-pay

## 2023-07-05 ENCOUNTER — Telehealth (INDEPENDENT_AMBULATORY_CARE_PROVIDER_SITE_OTHER): Payer: Self-pay | Admitting: Primary Care
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
## 2023-07-05 NOTE — Telephone Encounter (Signed)
 Copied from CRM [id number]. Topic: Appointments - Appointment Cancel/Reschedule >> Jul 05, 2023  9:43 AM Myrick T wrote: Patient/patient representative is calling to cancel or reschedule an appointment. Refer to attachments for appointment information. Need to reschedule patient new patient appt as he was not able to make appt today due to a conflict in his schedule

## 2023-07-05 NOTE — Telephone Encounter (Signed)
Called pt but I could not leave VM. I wanted to reschedule New pt atp.

## 2023-07-10 NOTE — Congregational Nurse Program (Signed)
  Dept: [phone]   Congregational Nurse Program Note  Date of Encounter: 07/10/2023  Past Medical History: Past Medical History:  Diagnosis Date   ADHD    Leg fracture, left    Leg fracture, right     Encounter Details:  Community Questionnaire - 07/10/23 1000       Questionnaire   Ask client: Do you give verbal consent for me to treat you today? Yes    Student Assistance N/A    Location Patient Served  [facility]    Encounter Setting CN site    Population Status Unhoused    Insurance Medicaid    Insurance/Financial Assistance Referral N/A    Medication N/A    Medical Provider No    Screening Referrals Made N/A    Medical Referrals Made 1st time PCP connection    Medical Appointment Completed N/A    CNP Interventions Advocate/Support;Counsel;Educate;Spiritual [employer] System;Case Management    Screenings CN Performed N/A    ED Visit Averted N/A    Life-Saving Intervention Made N/A            RN follow up if he was able to reschedule appointment with [facility], he states he didn't receive call. RN helped client reschedule  appointment, [facility] stated last week that prescribed provider was no longer accepting new clients. Therefore, RN helped client with [facility] PCP at [facility] on Thursday Feb. 13th, 2025 at 11 am to be seen by a provider.

## 2023-07-13 ENCOUNTER — Ambulatory Visit: Payer: Self-pay | Admitting: Emergency Medicine

## 2023-07-13 ENCOUNTER — Telehealth: Payer: Self-pay | Admitting: Emergency Medicine

## 2023-07-13 ENCOUNTER — Other Ambulatory Visit (HOSPITAL_COMMUNITY): Payer: Self-pay

## 2023-07-13 VITALS — BP 108/73 | HR 86 | Resp 16 | Ht 67.72 in | Wt 158.0 lb

## 2023-07-13 DIAGNOSIS — Z113 Encounter for screening for infections with a predominantly sexual mode of transmission: Secondary | ICD-10-CM

## 2023-07-13 DIAGNOSIS — R300 Dysuria: Secondary | ICD-10-CM

## 2023-07-13 DIAGNOSIS — F32A Depression, unspecified: Secondary | ICD-10-CM

## 2023-07-13 DIAGNOSIS — S93401A Sprain of unspecified ligament of right ankle, initial encounter: Secondary | ICD-10-CM

## 2023-07-13 MED ORDER — IBUPROFEN 600 MG PO TABS
600.0000 mg | ORAL_TABLET | Freq: Three times a day (TID) | ORAL | 0 refills | Status: DC | PRN
  Filled 2023-07-13: qty 30, 10d supply, fill #0

## 2023-07-13 NOTE — Progress Notes (Signed)
The patient attended his virtual primary care appt on 07/13/23. Where his BP screening results were wnl. At the appt the patient noted he had food, housing and transportation insecurities. Patient was given SDOH Resources at the appt. Per chart review pt did not have a pcp and does not have insurance. Chart review also did not indicate future appts. A follow up will be done at a later date per [facility] protocol.

## 2023-07-13 NOTE — Patient Instructions (Addendum)
[facility] [address]  [place], [place] [address] [phone]

## 2023-07-13 NOTE — Progress Notes (Addendum)
 Virtual Primary Care Telehealth Visit  Virtual Visit Consent  [name], you are scheduled for a virtual visit with a [facility] provider today. Just as with appointments in the office, your consent must be obtained to participate. Your consent will be active for this visit and any virtual visit you may have with one of our providers in the next 365 days. If you have a MyChart account, a copy of this consent can be sent to you electronically.  By engaging in this virtual visit, you consent to the provision of healthcare and authorize for your insurance to be billed (if applicable) for the services provided during this visit. Depending on your insurance coverage, you may receive a charge related to this service.  I need to obtain your verbal consent now. Are you willing to proceed with your visit today? Robert Silva has provided verbal consent on 07/13/2023 for a virtual visit (via Tytoc[employer]). Cathlyn Parsons, NP  Date: 07/20/2023 8:52 AM  Virtual Visit via Video Note   I, Cathlyn Parsons, connected with  Robert Silva  ([id number], 2001/02/05) on 07/13/23 at 11:00 AM EST by a video-enabled telemedicine application and verified that I am speaking with the correct person using two identifiers.  Telepresenter, Margorie John, present for entirety of visit to assist with video functionality and physical examination via TytoCare device.   This is an initial visit to discuss the opportunity to become a primary care patient at [facility]  The patient understands that if their medical background is complex, their case will be reviewed with the Medical Director, and if Virtual Primary Care is not the ideal location for their care, our team will help establish the patient with a primary care provider in the area.   If this is determined that this location is not the best option for the patient, in the future if the patient's medical condition changes we can re explore the option of this location serving  as their primary care location.  The patient understands that by becoming a primary care patient, this would be the location for their primary care, and if they chose to leave this location and seek primary care services at another location they will not be able to continue their relationship with this clinic.    Location: Patient: Virtual Visit Location Patient: VPC visit location: [facility]  Provider: Virtual Visit Location Provider: Home Office   History of Present Illness: Robert Silva is a 23 y.o. who identifies as a male who was assigned male at birth, and is being seen today as a new patient with the Virtual Primary Care Group.   Do you have a current Primary Care Provider? No Have you seen a Primary Care Provider in the past? Yes Do you live in [facility]? No Do you live in the [facility]? No  Do you have a history of Diabetes No Do you have a history of HTN No Do you have a history of high cholesterol? No Have you been diagnosed with kidney disease or kidney failure in the past? No  Have you been diagnosed with sleep apnea? No Have you been diagnosed with Asthma or COPD? No Do you have a history of tobacco use? Yes Smokes 2-3 cigarettes per day. Smokes marijuana 4-5 times per day  Have you been diagnosed with ADHD? Yes Have you been diagnosed with anxiety or depression? Yes  Have you had an organ transplant in the past No  Do you have any prescriptions for controlled  medications No  Do you see any specialists currently No  Pt reports drinking alcohol and injuring his R ankle last night. Can bear weight but it hurts. Denies hitting his head or other injury.   Hx ADHD - not currently on meds. Wants to be on meds. Hx anxiety and depression per pt - never dx or treated for this. Denies SI - about a month ago, was grieving loss of mother who died in last year, considered jumping off a bridge to "join her" but did not. No thoughts or plans for suicide  today.   HPI: HPI  Problems:  Patient Active Problem List   Diagnosis Date Noted   Homelessness 06/21/2022   Inadequate community resources 06/21/2022   Attention deficit hyperactivity disorder (ADHD) 06/20/2022   Adjustment disorder with mixed anxiety and depressed mood 06/20/2022   Exercise induced bronchospasm 05/14/2020   Asthma, mild intermittent 03/23/2020    Allergies:  Allergies  Allergen Reactions   Fava Beans Anaphylaxis   Sulfa Antibiotics Anaphylaxis   Fish Allergy    Justicia Adhatoda (Malabar Nut Tree) [Justicia Adhatoda]    Sulfa Antibiotics    Medications:  Current Outpatient Medications:    ibuprofen (ADVIL) 600 MG tablet, Take 1 tablet (600 mg total) by mouth every 8 (eight) hours as needed., Disp: 30 tablet, Rfl: 0  Observations/Objective:  BP 108/73 (BP Location: Left Arm, Patient Position: Sitting, Cuff Size: Normal)   Pulse 86   Resp 16   Ht 5' 7.72" (1.72 m)   Wt 158 lb (71.7 kg)   SpO2 98%   BMI 24.23 kg/m   Physical Exam Constitutional:      General: He is not in acute distress.    Appearance: Normal appearance. He is not ill-appearing.  Pulmonary:     Effort: Pulmonary effort is normal.  Musculoskeletal:     Right ankle: Swelling present. Tenderness present over the lateral malleolus. Normal range of motion. Normal pulse.     Left ankle: Normal.     Comments: Swelling and tenderness R lateral mallelar area. Per CMA assessment, he is limping slightly, favoring RLE  Neurological:     Mental Status: He is alert.  Psychiatric:        Attention and Perception: Attention normal.        Mood and Affect: Mood is elated.        Speech: Speech normal.        Behavior: Behavior is hyperactive. Behavior is cooperative.        Thought Content: Thought content does not include homicidal or suicidal ideation. Thought content does not include homicidal or suicidal plan.        Cognition and Memory: Cognition and memory normal.        Judgment: Judgment  is impulsive.     Assessment and Plan: 1. Routine screening for STI (sexually transmitted infection) (Primary) - RPR - HIV Antibody (routine testing w rflx) - STI Profile, CT/NG/TV  2. Dysuria - Urinalysis, Routine w reflex microscopic  3. Depression, unspecified depression type - Ambulatory referral to Behavioral Health  4. Sprain of right ankle, unspecified ligament, initial encounter  He and I discussed he may be self-medicating with marijuana (and/or alcohol) to address his mental health. I think he will benefit from behavioral health eval and treatment. He is very opposed to reducing or stopping marijuana  I do not have ankle brace to offer him. I do not think he has a fracture of his R ankle, more likely sprain.  Rx ibuprofen for him to use for pain, discussed usual healing from ankle sprain.   Follow Up Instructions: I discussed the assessment and treatment plan with the patient. The Telepresenter provided patient with a physical copy of my written instructions for review.   The patient was advised to call back or seek an in-person evaluation if the symptoms worsen or if the condition fails to improve as anticipated.    Cathlyn Parsons, NP  **Disclaimer: This note may have been dictated with voice recognition software. Similar sounding words can inadvertently be transcribed and this note may contain transcription errors which may not have been corrected upon publication of note.**

## 2023-07-13 NOTE — Progress Notes (Signed)
Pt presents to establish care, -needs PCP  -was in prison for 2 years has not been seen by anyone since pediatrics  -pt has hx of ADHD/declines any referrals or medication for management

## 2023-07-14 LAB — RPR: RPR Ser Ql: NONREACTIVE

## 2023-07-14 LAB — HIV ANTIBODY (ROUTINE TESTING W REFLEX): HIV Screen 4th Generation wRfx: NONREACTIVE

## 2023-07-17 LAB — URINALYSIS, ROUTINE W REFLEX MICROSCOPIC
Bilirubin, UA: NEGATIVE
Glucose, UA: NEGATIVE
Ketones, UA: NEGATIVE
Leukocytes,UA: NEGATIVE
Nitrite, UA: NEGATIVE
Protein,UA: NEGATIVE
RBC, UA: NEGATIVE
Specific Gravity, UA: 1.023 (ref 1.005–1.030)
Urobilinogen, Ur: 1 mg/dL (ref 0.2–1.0)
pH, UA: 8 — ABNORMAL HIGH (ref 5.0–7.5)

## 2023-07-17 LAB — SPECIMEN STATUS REPORT

## 2023-07-17 NOTE — Congregational Nurse Program (Unsigned)
  Dept: [phone]   Congregational Nurse Program Note  Date of Encounter: 07/17/2023  Past Medical History: Past Medical History:  Diagnosis Date   ADHD    Leg fracture, left    Leg fracture, right     Encounter Details:  Community Questionnaire - 07/17/23 0904       Questionnaire   Ask client: Do you give verbal consent for me to treat you today? Yes    Student Assistance N/A    Location Patient Served  [facility]    Encounter Setting CN site    Population Status Unhoused    Insurance Medicaid    Insurance/Financial Assistance Referral N/A    Medication N/A    Medical Provider No    Screening Referrals Made N/A    Medical Referrals Made 1st time PCP connection    Medical Appointment Completed [facility];1st time PCP connection    CNP Interventions Advocate/Support;Counsel;Educate;Spiritual [employer] [facility];Case Management    Screenings CN Performed N/A    ED Visit Averted N/A    Life-Saving Intervention Made N/A            Robert Silva to RN office for continued spiritual care. RN offered support, empathetic listening and therapeutic communication.

## 2023-07-18 LAB — STI PROFILE, CT/NG/TV
HCV Ab: NONREACTIVE
HIV Screen 4th Generation wRfx: NONREACTIVE
Hep B Core Total Ab: NEGATIVE
Hep B Surface Ab, Qual: REACTIVE
Hepatitis B Surface Ag: NEGATIVE
RPR Ser Ql: NONREACTIVE

## 2023-07-18 LAB — HCV INTERPRETATION

## 2023-07-18 LAB — SPECIMEN STATUS REPORT

## 2023-07-19 LAB — STI PROFILE, CT/NG/TV
Chlamydia by NAA: NEGATIVE
Gonococcus by NAA: UNDETERMINED — AB
Trich vag by NAA: NEGATIVE

## 2023-07-25 ENCOUNTER — Other Ambulatory Visit (HOSPITAL_COMMUNITY): Payer: Self-pay

## 2023-07-27 NOTE — Congregational Nurse Program (Signed)
 Client checked in for spiritual care and given spiritual encouragement and motivation.

## 2023-08-18 ENCOUNTER — Telehealth: Payer: Self-pay

## 2023-08-18 NOTE — Telephone Encounter (Signed)
 Att to contact pt to schedule f/u appt no ans unable to lvm no mailbox set up.

## 2023-08-23 IMAGING — CT CT HEAD W/O CM
5 series · 17 of 47 positions shown, 18 images · non-contrast
Comparison: None Available.

CLINICAL DATA: Seizure, new-onset, no history of trauma



[Series 3: head without · axial · non-contrast · 0.44mm/px · z∈[+7,+117]mm · 4 of 38 slices shown, 5 images]
[im 8/38  brain]
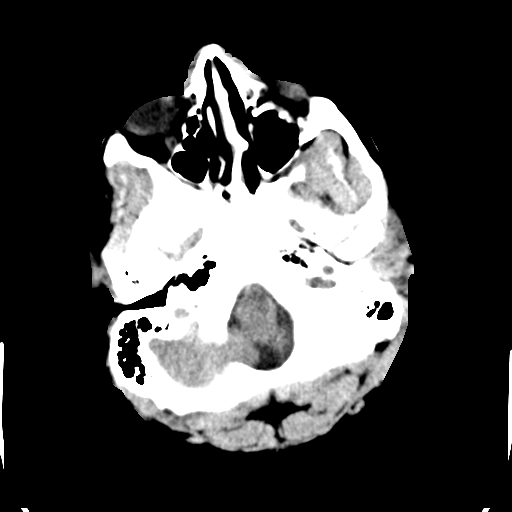
[im 8/38  bone]
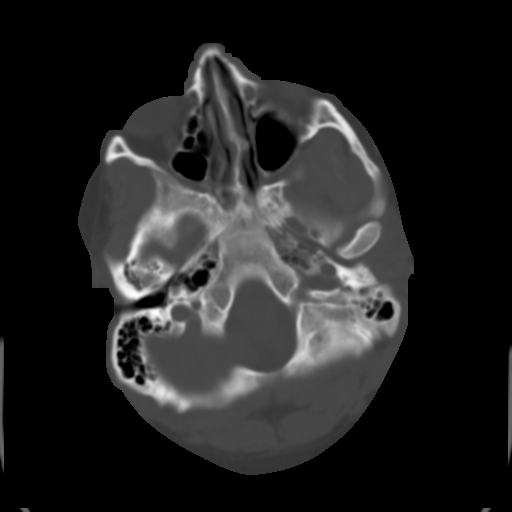
[im 15/38  brain]
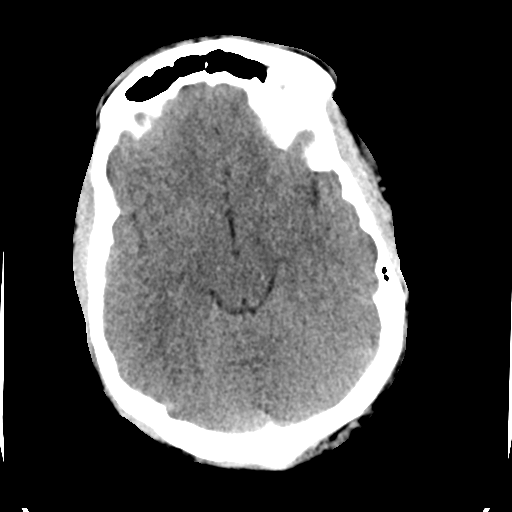
[im 23/38  brain]
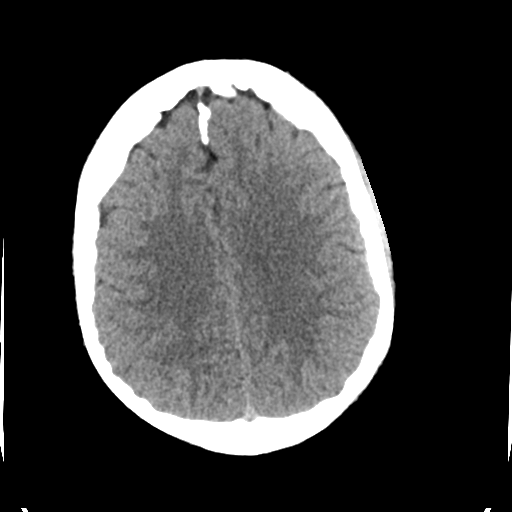
[im 30/38  brain]
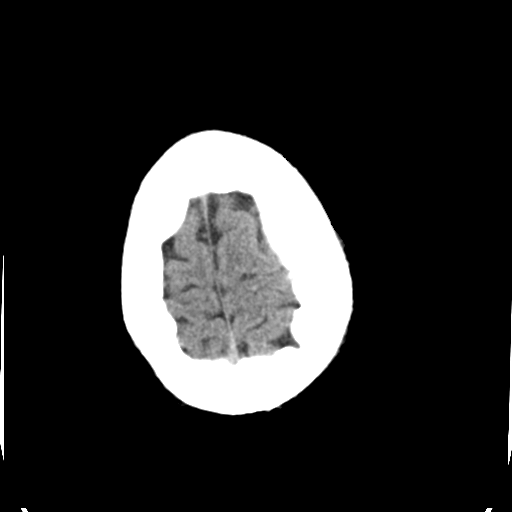

[Series 4: head bone · axial · 0.44mm/px · z∈[-18,+6]mm · 2 of 95 slices shown]
[im 6/95  bone]
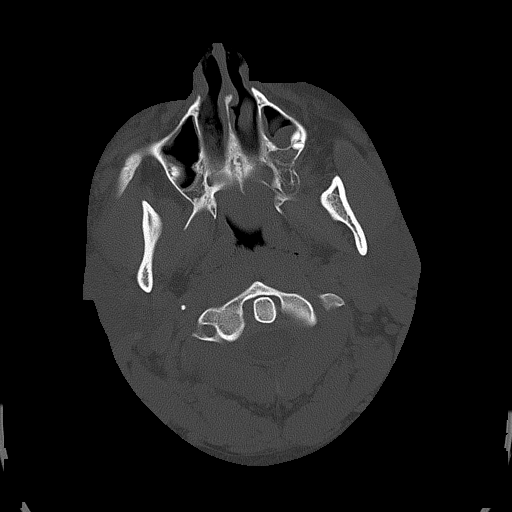
[im 18/95  bone]
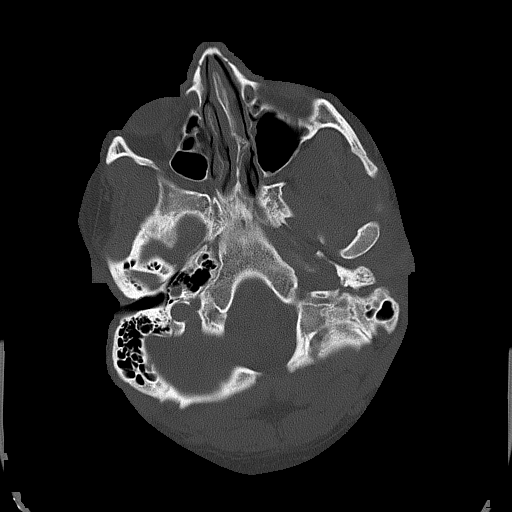

[Series 5: head without cor · coronal · non-contrast · 0.37mm/px · 3 of 74 slices shown]
[im 25/74  brain]
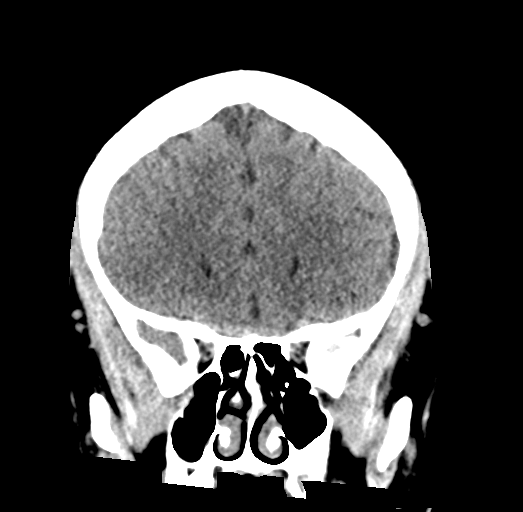
[im 33/74  brain]
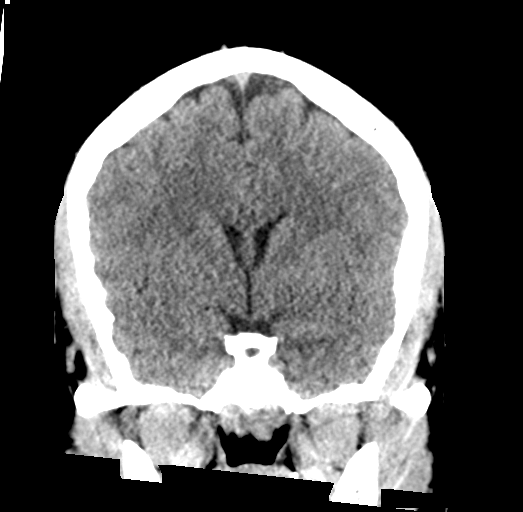
[im 41/74  brain]
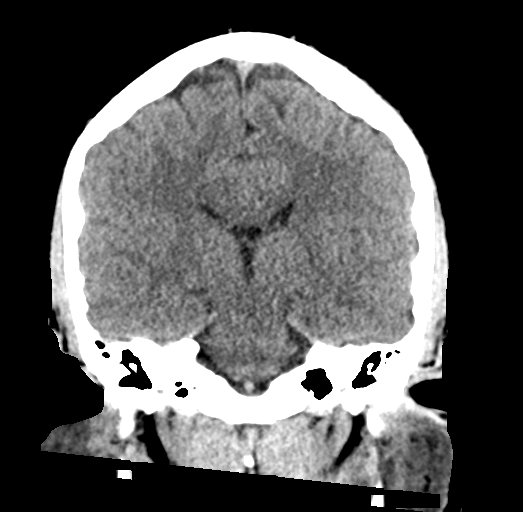

[Series 6: head without sag · sagittal · non-contrast · 0.37mm/px · 3 of 66 slices shown]
[im 22/66  brain]
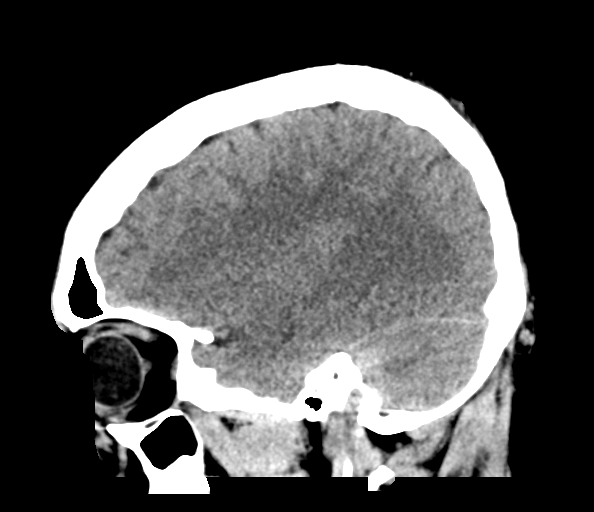
[im 33/66  brain]
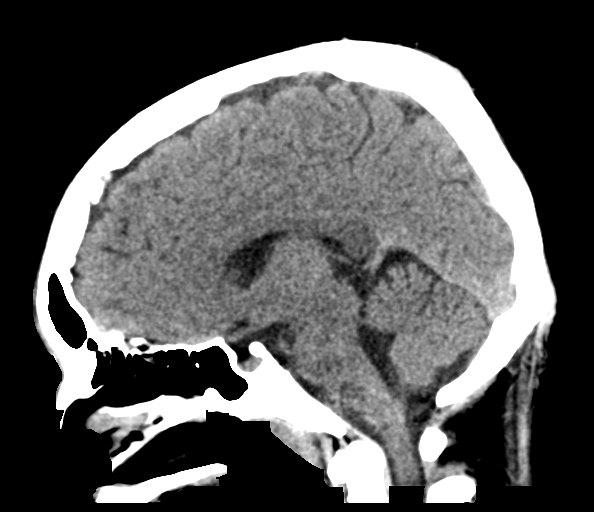
[im 44/66  brain]
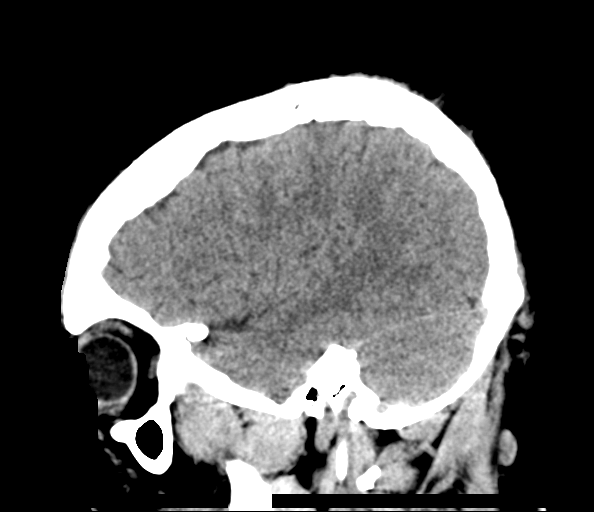

[Series 7: head without ax · axial · non-contrast · 0.36mm/px · z∈[-12,+107]mm · 5 of 37 slices shown]
[im 7/37  brain]
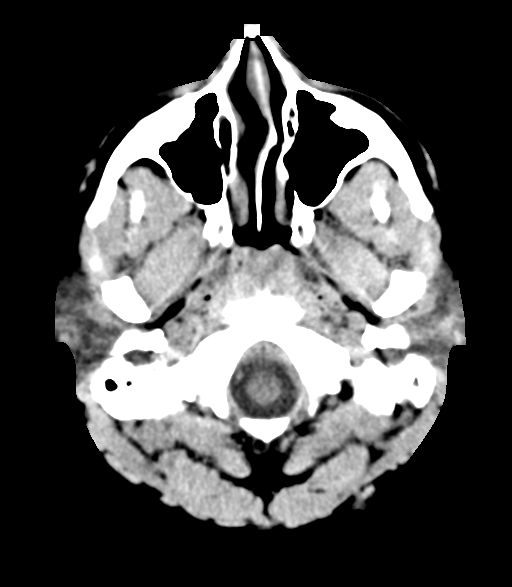
[im 13/37  brain]
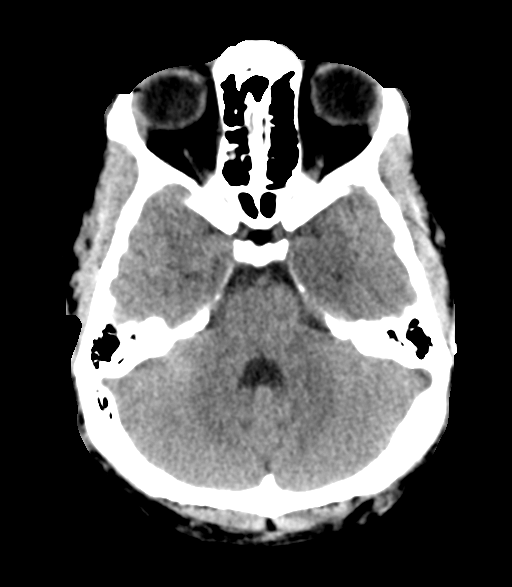
[im 19/37  brain]
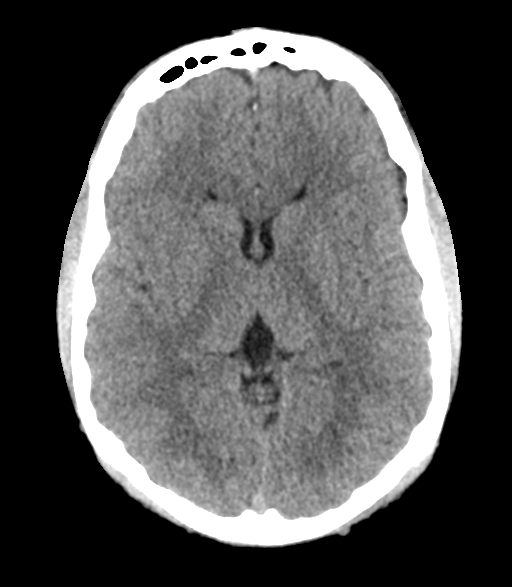
[im 25/37  brain]
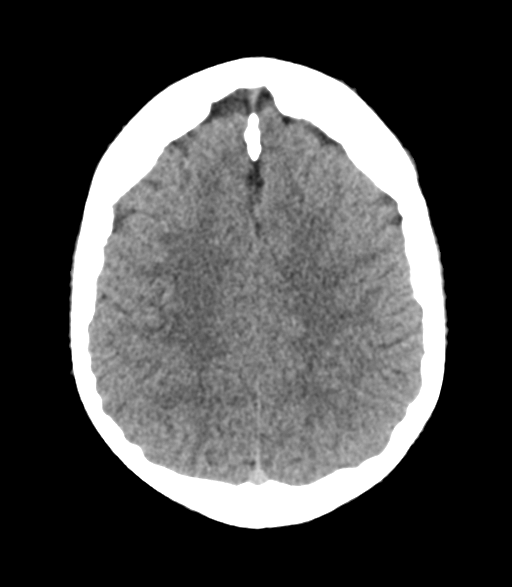
[im 31/37  brain]
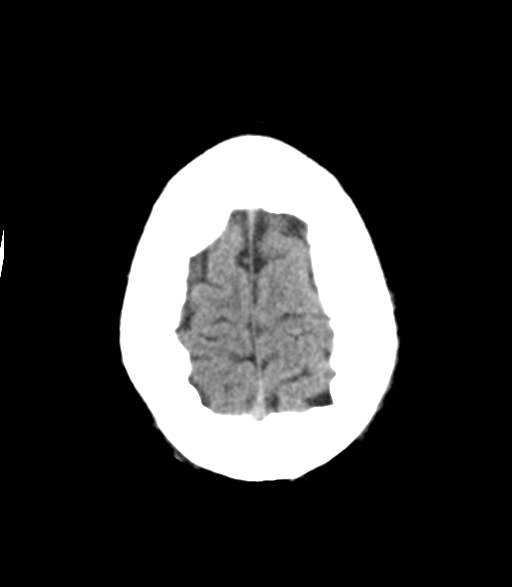

[17 of 47 positions shown; findings below may reference images not displayed]

FINDINGS: Brain: Normal anatomic configuration. No abnormal intra or
extra-axial mass lesion or fluid collection. No abnormal mass effect
or midline shift. No evidence of acute intracranial hemorrhage or
infarct. Ventricular size is normal. Cerebellum unremarkable.

Vascular: Unremarkable

Skull: Intact

Sinuses/Orbits: Small mucous retention cyst noted within the
visualized maxillary sinuses bilaterally. No air-fluid levels.
Paranasal sinuses are otherwise clear. Remote right medial orbital
wall fracture. Orbits are otherwise unremarkable.

Other: Mastoid air cells and middle ear cavities are clear.
IMPRESSION: No acute intracranial abnormality.

Remote right medial orbital wall fracture.

Minimal bilateral maxillary sinus disease.

## 2023-08-29 NOTE — Congregational Nurse Program (Signed)
 Client to RN office for support. Spiritual care was provided through the ministry of peace, active listening and therapeutic communication. Client expressed that he is currently facing significant life challenges and was offered space to share openly without judgement. Focus of visit was on emotional and spiritual support, not problem solving. RN offered encouragement and affirmed client's resilience and dignity. Resources discussed as appropriate. No immediate safety concerns noted. Client appeared receptive and expressed appreciation for the time and space provided.

## 2023-08-31 NOTE — Congregational Nurse Program (Signed)
 RN to client office for assistance in getting glasses. RN advised client to Robert Silva on [address] for vision exam and if glasses indicated Medicaid will help assist him pay for it. No further needs at this time.

## 2023-09-04 NOTE — Congregational Nurse Program (Signed)
  Dept: [phone]   Congregational Nurse Program Note  Date of Encounter: 09/04/2023  Past Medical History: Past Medical History:  Diagnosis Date   ADHD    Leg fracture, left    Leg fracture, right     Encounter Details:  Community Questionnaire - 09/04/23 1030       Questionnaire   Ask client: Do you give verbal consent for me to treat you today? Yes    Student Assistance N/A    Location Patient Served  [facility]    Encounter Setting CN site    Population Status Unhoused    Insurance Medicaid    Insurance/Financial Assistance Referral N/A    Medication N/A    Medical Provider No    Screening Referrals Made N/A    Medical Referrals Made Vision;[facility]    Medical Appointment Completed N/A    CNP Interventions Advocate/Support;Counsel;Educate;Spiritual [employer] [facility];Case Management    Screenings CN Performed N/A    ED Visit Averted N/A    Life-Saving Intervention Made N/A            Client to RN office for mental health needs. Client is emotionally overwhelmed and seeking a safe space to process current life stressors. Client disclosed several upsetting events that have recently happened and he expressed frustration, anger, and a desire to make healthier decisions, while also feeling uncertain about how to move forward. RN provided a supportive, nonjudgmental environment utilizing empathetic  and active listening as well as therapeutic communication. Client was encouraged to consider mental health counseling as an additional support. Visit focused on emotional support and creating space for client to reflect openly on internal conflict and decision making. No safety concerns noted at this time.

## 2023-09-06 NOTE — Congregational Nurse Program (Signed)
 Client presented to RN seeking emotional and moral support related to recent personal relationship challenges. Client expressed that he is not interested in engaging with a mental health therapist at this time and instead desired a supportive conversation and spiritual care from RN.  RN provided therapeutic communication, empathetic listening, and spiritual encouragement during the visit. Client guided RN through reflection on an upcoming difficult conversation with a significant person in his life and supported in processing the complex emotions surrounding his current situation. RN offered ministry of presence and compassionate support while encouraging client to consider professional mental health services in the future.   No acute safety concerns noted. RN will continue to offer spiritual and emotional support within the boundaries of RN role.

## 2023-09-07 NOTE — Congregational Nurse Program (Signed)
 Client returned to RN for a brief check in and reflection following a conversation from the previous day. Client expressed appreciation for the support received and shared that he has been working toward making more mature and accountable life decisions. He expressed a sense of pride in being more honest and intentional in his actions, while also acknowledging the emotional difficulty that can come with personal growth.  Client shared that although he continues to face challenges, he feels he is moving in a more positive direction. RN provided therapeutic communication, active listening, and emotional support within the nursing scope of practice. RN also discussed how the client may benefit from working with a licensed mental health professional for ongoing support and deeper exploration of emotional concerns beyond what nursing support can provide.  Client was receptive to the conversation. No acute needs or safety concerns noted at this time. RN will continue to offer supportive care and monitor as appropriate.

## 2023-09-18 NOTE — Congregational Nurse Program (Signed)
 Client to RN office. He is overwhelmed and frustrated. He has had many stressing events over the last several days. He has a very difficult time calming himself down when he is upset and RN assisted in deescalating with client. RN participated in active and empathetic listening for client to just release frustrations, music therapy by playing some of his favorite songs and practicing Lectio Divina- RN read Psalms 6 to him and he did endorse feeling better. RN continues to highly recommend client to go to see a therapist and offered resources and education so that he understands professional help and medication management would help him significantly with managing his emotions when he is in emotional distress. RN will continue to follow and advocate for this client as necessary.

## 2023-09-20 NOTE — Congregational Nurse Program (Signed)
 Client to RN office for daily check in. He is currently overwhelmed with recent life events. He is requesting active listening, empathetic listening, therapeutic communication. RN recommended client to also seek professional advice from a therapist to help him better regulate his emotions. Client has no further needs at this time.

## 2023-09-21 ENCOUNTER — Telehealth: Payer: Self-pay

## 2023-09-21 NOTE — Telephone Encounter (Signed)
 Att to contact pt to schedule appt no ans unable to lvm no mailbox set up

## 2023-09-25 NOTE — Congregational Nurse Program (Signed)
 Client to RN office for spiritual care. RN assisted in empathetic listening and ministry of presence. RN continues to refer client to therapist for ongoing emotional support. Client has no further concerns at this time.

## 2023-09-26 NOTE — Congregational Nurse Program (Signed)
 Client to RN office for daily check in. Client shared about recent life experiences that have been affecting his mental health lately. RN continues to reinforce the benefits of a mental health counselor to help him function emotionally in healthy ways. RN is able to actively and empathetically listen and offer therapeutic communication and presence. Client has no further concerns.

## 2023-09-27 NOTE — Congregational Nurse Program (Signed)
 Client requesting to speak to RN. He is frustrated, overwhelmed and states he is "crashing out." Client shared about recent life experiences that have been affecting his mental health significantly. RN continues to reinforce the benefits of a mental health therapist to help him function emotionally in healthy ways. Learn how to cope and deal with difficult emotions so he would feel more stable and not emotionally fragile. RN is able to actively and empathetically listen and offer therapeutic communication and presence. Client has no further concerns.

## 2023-10-02 NOTE — Congregational Nurse Program (Signed)
 Client requesting to speak to RN. He is frustrated, overwhelmed and states he is "crashing out." Client shared about recent life experiences that have been affecting his mental health significantly. RN continues to reinforce the benefits of a mental health therapist to help him function emotionally in healthy ways. Learn how to cope and deal with difficult emotions so he would feel more stable and not emotionally fragile. RN is able to actively and empathetically listen and offer therapeutic communication and presence. Client has no further concerns.

## 2023-10-04 ENCOUNTER — Ambulatory Visit (HOSPITAL_COMMUNITY)
Admission: EM | Admit: 2023-10-04 | Discharge: 2023-10-04 | Disposition: A | Discharge status: Home / Self Care | Attending: Psychiatry | Admitting: Psychiatry

## 2023-10-04 DIAGNOSIS — Z765 Malingerer [conscious simulation]: Secondary | ICD-10-CM

## 2023-10-04 DIAGNOSIS — Z5900 Homelessness unspecified: Secondary | ICD-10-CM

## 2023-10-04 DIAGNOSIS — F1290 Cannabis use, unspecified, uncomplicated: Secondary | ICD-10-CM

## 2023-10-04 NOTE — ED Provider Notes (Signed)
 Behavioral Health Urgent Care Medical Screening Exam  Patient Name: Robert Silva MRN: [id number] Date of Evaluation: 10/04/23 Chief Complaint: need detox from marijuana  Diagnosis:  Final diagnoses:  Marijuana use  Homelessness unspecified  Malingering    History of Present illness: [name] is a 23 y.o. male. With a history of homelessness, ADHD, adjustment disorder.  Presented to [facility] voluntarily.  Per the patient he is looking detox from marijuana.  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  Patient went on to state that he wants to go get custody of his son.  However patient is currently homeless.  When asked about the custody of his son patient stated he is going to get the son and give it to someone.  Patient story does seem to be inconsistent, and does seem like patient is malingering for somewhere to stay.  A review of patient records show that patient was seen in January of last year for similar situation.  Patient does not appear to be in any distress does not appear to be influenced by internal or external stimuli.  According to patient he is not seeing a psychiatrist or therapist.   Face-to-face observation of patient, patient is alert and oriented x 4, speech is clear, maintaining eye contact.  Patient does seem to be malingering for secondary gain due to homelessness.  Patient denies SI, HI, AVH or paranoia.  Denies alcohol use, reports he smoked marijuana every now and then the last time was yesterday also reported he used meth but it is every now and then.  Writer discussed with patient outpatient substance abuse program as patient does not meet criteria for detox admission as patient is not an albuterol user of meth.  Patient went on to give a story that he was  locked up for 4 years when asked what was he locked up for patient stated that he was locked up for assault with a deadly weapon with intent to kill and he was [place] [facility] [place] and got out last year.  Patient story does not seem to be consistent with what he told triage and while talking with patient his story keeps changing.  Writer discussed with patient the need to see a therapist and gave patient the information and resources.  Recommend discharge for patient to follow-up with resources provided.  Flowsheet Row ED from 10/04/2023 in [facility] Tele[facility] from 07/13/2023 in [facility] Virtual Primary Care ED from 06/23/2023 in [facility] Emergency Department at [facility]  C-SSRS RISK CATEGORY No Risk High Risk No Risk       Psychiatric Specialty Exam  Presentation  General Appearance:Casual  Eye Contact:Good  Speech:Clear and Coherent  Speech Volume:Normal  Handedness:Right   Mood and Affect  Mood: Anxious  Affect: Congruent   Thought Process  Thought Processes: Coherent  Descriptions of Associations:Intact  Orientation:Full (Time, Place and Person)  Thought Content:WDL; Scattered    Hallucinations:None  Ideas of Reference:None  Suicidal Thoughts:No  Homicidal Thoughts:No   Sensorium  Memory: Immediate Fair  Judgment: Fair  Insight: Fair   Art therapist  Concentration: Fair  Attention Span: Fair  Recall: Good  Fund of Knowledge: Good  Language: Good   Psychomotor Activity  Psychomotor Activity: Normal  Assets  Assets: Desire for Improvement; Housing   Sleep  Sleep: Fair  Number of hours:  6   Physical Exam: Physical Exam HENT:     Head: Normocephalic.     Nose: Nose normal.  Eyes:     Pupils: Pupils are equal, round, and reactive to light.  Cardiovascular:     Rate and Rhythm: Normal rate.  Pulmonary:     Effort: Pulmonary effort is normal.   Musculoskeletal:        General: Normal range of motion.     Cervical back: Normal range of motion.  Neurological:     General: No focal deficit present.     Mental Status: He is alert.  Psychiatric:        Mood and Affect: Mood normal.        Thought Content: Thought content normal.    Review of Systems  Constitutional: Negative.   HENT: Negative.    Eyes: Negative.   Respiratory: Negative.    Cardiovascular: Negative.   Gastrointestinal: Negative.   Genitourinary: Negative.   Musculoskeletal: Negative.   Skin: Negative.   Neurological: Negative.   Psychiatric/Behavioral:  Positive for substance abuse. The patient is nervous/anxious.    Blood pressure 131/79, pulse (!) 55, temperature 97.8 F (36.6 C), temperature source Oral, resp. rate 16, SpO2 100%. There is no height or weight on file to calculate BMI.  Musculoskeletal: Strength & Muscle Tone: within normal limits Gait & Station: normal Patient leans: N/A   [facility] MSE Discharge Disposition for Follow up and Recommendations: Based on my evaluation the patient does not appear to have an emergency medical condition and can be discharged with resources and follow up care in outpatient services for Substance Abuse Intensive Outpatient Program   Dorthea Gauze, NP 10/04/2023, 5:12 AM

## 2023-10-04 NOTE — Discharge Instructions (Addendum)
 Follow up with walking psychiatry and or resources provided.

## 2023-10-04 NOTE — Progress Notes (Signed)
   10/04/23 0415  [facility] Triage Screening (Walk-ins at [facility] only)  How Did You Hear About Us ? Self  What Is the Reason for Your Visit/Call Today? Pt presents to [facility] as a voluntary walk-in, unaccompanied requesting substance abuse treatment/detox. Pt reports that he needs treatment in order to regain custody of his son. Pt reports caseworker through DSS encouraged pt to seek treatment. Pt reports using marijuana and Meth a few days ago. Pt reports history of depression, but denies being established with outpatient resources. Pt is experiencing homelessness at this time. Pt currently denies SI,HI,AVH and alcohol use.  How Long Has This Been Causing You Problems? <Week  Have You Recently Had Any Thoughts About Hurting Yourself? No  Are You Planning to Commit Suicide/Harm Yourself At This time? No  Have you Recently Had Thoughts About Hurting Someone Marigene Shoulder? No  Are You Planning To Harm Someone At This Time? No  Physical Abuse Denies  Verbal Abuse Denies  Sexual Abuse Denies  Exploitation of patient/patient's resources Denies  Self-Neglect Denies  Are you currently experiencing any auditory, visual or other hallucinations? No  Have You Used Any Alcohol or Drugs in the Past 24 Hours? No  Do you have any current medical co-morbidities that require immediate attention? No  Clinician description of patient physical appearance/behavior: pt is calm, cooperative, casually dressed  What Do You Feel Would Help You the Most Today? Alcohol or Drug Use Treatment  If access to [facility] was not available, would you have sought care in the Emergency Department? No  Determination of Need Routine (7 days)  Options For Referral Other: Comment;Outpatient Therapy;Medication Management;Chemical Dependency Intensive Outpatient Therapy (CDIOP)
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
## 2024-01-25 NOTE — Congregational Nurse Program (Signed)
  Dept: [phone]   Congregational Nurse Program Note  Date of Encounter: 01/25/2024  Past Medical History: Past Medical History:  Diagnosis Date   ADHD    Leg fracture, left    Leg fracture, right     Encounter Details:  Community Questionnaire - 01/25/24 1207       Questionnaire   Ask client: Do you give verbal consent for me to treat you today? Yes    Student Assistance N/A    Location Patient Served  [facility]    Encounter Setting CN site    Population Status Unknown    Insurance Medicaid    Insurance/Financial Assistance Referral N/A    Medication N/A    Medical Provider No    Screening Referrals Made N/A    Medical Referrals Made [facility];N/A    Medical Appointment Completed N/A    CNP Interventions Advocate/Support;Counsel;Educate;Spiritual Care;Navigate Healthcare System;Case Management    Screenings CN Performed N/A    ED Visit Averted N/A    Life-Saving Intervention Made N/A         Client to RN office for visit. Client requesting spiritual care, RN able to provide ministry of presence, active listening and therapeutic communication. No further needs at this time. Client is appreciative of visit.

## 2024-02-01 NOTE — Congregational Nurse Program (Signed)
  Dept: [phone]   Congregational Nurse Program Note  Date of Encounter: 02/01/2024  Past Medical History: Past Medical History:  Diagnosis Date   ADHD    Leg fracture, left    Leg fracture, right     Encounter Details:  Community Questionnaire - 02/01/24 1509       Questionnaire   Ask client: Do you give verbal consent for me to treat you today? Yes    Student Assistance N/A    Location Patient Served  [facility]    Encounter Setting CN site    Population Status Unknown    Insurance Medicaid    Insurance/Financial Assistance Referral N/A    Medication N/A    Medical Provider No    Screening Referrals Made N/A    Medical Referrals Made  Health;N/A    Medical Appointment Completed N/A    CNP Interventions Advocate/Support;Counsel;Educate;Spiritual Care;Navigate Healthcare System;Case Management    Screenings CN Performed N/A    ED Visit Averted N/A    Life-Saving Intervention Made N/A         Client to RN office for spiritual care. RN able to provide spiritual care including active listening, therapeutic communication and prayer. No further needs identified at this time. RN will continue to follow.

## 2024-02-10 ENCOUNTER — Other Ambulatory Visit: Payer: Self-pay

## 2024-02-10 ENCOUNTER — Emergency Department (HOSPITAL_COMMUNITY): Payer: MEDICAID

## 2024-02-10 ENCOUNTER — Emergency Department (HOSPITAL_COMMUNITY)
Admission: EM | Admit: 2024-02-10 | Discharge: 2024-02-10 | Discharge status: Against Medical Advice | Payer: MEDICAID | Attending: Emergency Medicine | Admitting: Emergency Medicine

## 2024-02-10 DIAGNOSIS — R569 Unspecified convulsions: Secondary | ICD-10-CM | POA: Diagnosis present

## 2024-02-10 DIAGNOSIS — Z5900 Homelessness unspecified: Secondary | ICD-10-CM | POA: Diagnosis not present

## 2024-02-10 LAB — COMPREHENSIVE METABOLIC PANEL WITH GFR
ALT: 12 U/L (ref 0–44)
AST: 24 U/L (ref 15–41)
Albumin: 4.5 g/dL (ref 3.5–5.0)
Alkaline Phosphatase: 99 U/L (ref 38–126)
Anion gap: 11 (ref 5–15)
BUN: 12 mg/dL (ref 6–20)
CO2: 27 mmol/L (ref 22–32)
Calcium: 10 mg/dL (ref 8.9–10.3)
Chloride: 105 mmol/L (ref 98–111)
Creatinine, Ser: 0.88 mg/dL (ref 0.61–1.24)
GFR, Estimated: >60 mL/min (ref >60)
Glucose, Bld: 77 mg/dL (ref 70–99)
Potassium: 4.2 mmol/L (ref 3.5–5.1)
Sodium: 142 mmol/L (ref 135–145)
Total Bilirubin: 0.7 mg/dL (ref 0.0–1.2)
Total Protein: 7.1 g/dL (ref 6.5–8.1)

## 2024-02-10 LAB — CBC
HCT: 41.7 % (ref 39.0–52.0)
Hemoglobin: 12.7 g/dL — ABNORMAL LOW (ref 13.0–17.0)
MCH: 29.9 pg (ref 26.0–34.0)
MCHC: 30.5 g/dL (ref 30.0–36.0)
MCV: 98.1 fL (ref 80.0–100.0)
Platelets: 242 K/uL (ref 150–400)
RBC: 4.25 MIL/uL (ref 4.22–5.81)
RDW: 12.3 % (ref 11.5–15.5)
WBC: 6.1 K/uL (ref 4.0–10.5)
nRBC: 0 % (ref 0.0–0.2)

## 2024-02-10 LAB — CBG MONITORING, ED: Glucose-Capillary: 74 mg/dL (ref 70–99)

## 2024-02-10 MED ORDER — LEVETIRACETAM (KEPPRA) 500 MG/5 ML ADULT IV PUSH
1000.0000 mg | Freq: Once | INTRAVENOUS | Status: AC
  Administered 2024-02-10: 1000 mg via INTRAVENOUS
  Filled 2024-02-10: qty 10

## 2024-02-10 NOTE — ED Notes (Signed)
 Informed by triage techs that the pt wanted to leave and walked out while I was in another room. IV was removed by techs.

## 2024-02-10 NOTE — ED Triage Notes (Signed)
 Chief Complaint  Patient presents with   Seizures    [place] a seizure that lasted less than a minute. Alert and oriented x 4 on arrival to triage. Pt reports that he has not had his Keppra  in over week because he does not have pcp.

## 2024-02-10 NOTE — Care Management (Signed)
 Patient presents with seizure activity lasting one minute. He has been out of Keppra  a week due to no PCP. PCP information on AVS. Patient has insurance therefore is not eligible for MATCH medication assistance.
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
## 2024-02-29 NOTE — Congregational Nurse Program (Signed)
  Dept: [phone]   Congregational Nurse Program Note  Date of Encounter: 02/29/2024  Past Medical History: Past Medical History:  Diagnosis Date   ADHD    Leg fracture, left    Leg fracture, right     Encounter Details:  Community Questionnaire - 02/29/24 1445       Questionnaire   Ask client: Do you give verbal consent for me to treat you today? Yes    Student Assistance N/A    Location Patient Served  [facility]    Encounter Setting CN site    Population Status Unknown    Insurance Medicaid    Insurance/Financial Assistance Referral N/A    Medication N/A    Medical Provider No    Screening Referrals Made N/A    Medical Referrals Made N/A    Medical Appointment Completed N/A    CNP Interventions Advocate/Support;Counsel;Educate;Spiritual [employer] System;Case Management    Screenings CN Performed N/A    ED Visit Averted N/A    Life-Saving Intervention Made N/A         Client to RN office for spiritual care. He needed to discuss life challenges and difficulties. RN able to provide ministry of presence, active listening, therapeutic communication, and spiritual encouragement. Client is appreciative of visit.

## 2024-03-04 ENCOUNTER — Other Ambulatory Visit: Payer: Self-pay

## 2024-03-04 ENCOUNTER — Telehealth: Payer: MEDICAID | Admitting: Nurse Practitioner

## 2024-03-04 DIAGNOSIS — Z8659 Personal history of other mental and behavioral disorders: Secondary | ICD-10-CM

## 2024-03-04 DIAGNOSIS — F4322 Adjustment disorder with anxiety: Secondary | ICD-10-CM

## 2024-03-04 DIAGNOSIS — B372 Candidiasis of skin and nail: Secondary | ICD-10-CM | POA: Diagnosis not present

## 2024-03-04 DIAGNOSIS — R454 Irritability and anger: Secondary | ICD-10-CM | POA: Diagnosis not present

## 2024-03-04 MED ORDER — NYSTATIN 100000 UNIT/GM EX POWD
1.0000 | Freq: Three times a day (TID) | CUTANEOUS | 0 refills | Status: DC
  Filled 2024-03-04: qty 30, 30d supply, fill #0
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
## 2024-03-07 NOTE — Congregational Nurse Program (Signed)
  Dept: [phone]   Congregational Nurse Program Note  Date of Encounter: 03/07/2024  Past Medical History: Past Medical History:  Diagnosis Date   ADHD    Leg fracture, left    Leg fracture, right     Encounter Details:  Community Questionnaire - 03/07/24 0946       Questionnaire   Ask client: Do you give verbal consent for me to treat you today? Yes    Student Assistance N/A    Location Patient Served  [facility]    Encounter Setting CN site    Population Status Unknown    Insurance Medicaid    Insurance/Financial Assistance Referral N/A    Medication N/A    Medical Provider No    Screening Referrals Made N/A    Medical Referrals Made N/A    Medical Appointment Completed N/A    CNP Interventions Advocate/Support;Counsel;Educate;Spiritual [employer] System;Case Management    Screenings CN Performed N/A    ED Visit Averted N/A    Life-Saving Intervention Made N/A          Client to RN office for scheduled visit. Client is feeling overwhelmed and emotional about personal events and needing a safe space to vent and process. RN able to provide spiritual care and make referral for client to see his therapist at [facility]. Client appreciative of conversation and RN assisted with deep breathing with client to help with stress and anxiety. No immediate concerns at this time.

## 2024-03-12 ENCOUNTER — Other Ambulatory Visit: Payer: Self-pay

## 2024-03-13 ENCOUNTER — Other Ambulatory Visit: Payer: Self-pay

## 2024-03-14 NOTE — Congregational Nurse Program (Signed)
  Dept: [phone]   Congregational Nurse Program Note  Date of Encounter: 03/14/2024  Past Medical History: Past Medical History:  Diagnosis Date   ADHD    Leg fracture, left    Leg fracture, right     Encounter Details:  Community Questionnaire - 03/14/24 1340       Questionnaire   Ask client: Do you give verbal consent for me to treat you today? Yes    Student Assistance N/A    Location Patient Served  [facility]    Encounter Setting CN site    Population Status Unknown    Insurance Medicaid    Insurance/Financial Assistance Referral N/A    Medication N/A    Medical Provider No    Screening Referrals Made N/A    Medical Referrals Made Non-[facility]    Medical Appointment Completed Non-[facility]    CNP Interventions Advocate/Support;Counsel;Educate;Spiritual [employer] System;Case Management    Screenings CN Performed N/A    ED Visit Averted N/A    Life-Saving Intervention Made N/A          Client to RN office for scheduled visit. Client is feeling overwhelmed and emotional about personal events and needing a safe space to vent and process. RN able to provide spiritual care and make referred client to see his therapist at [facility] [place]. Client appreciative of conversation and helped client by sitting outside and to help with stress and anxiety management. No immediate concerns at this time. RN will continue to follow.

## 2024-03-21 NOTE — Congregational Nurse Program (Signed)
  Dept: [phone]   Congregational Nurse Program Note  Date of Encounter: 03/21/2024  Past Medical History: Past Medical History:  Diagnosis Date   ADHD    Leg fracture, left    Leg fracture, right     Encounter Details:  Community Questionnaire - 03/21/24 1624       Questionnaire   Ask client: Do you give verbal consent for me to treat you today? Yes    Student Assistance N/A    Location Patient Served  [facility]    Encounter Setting CN site    Population Status Unknown    Insurance Medicaid    Insurance/Financial Assistance Referral N/A    Medication N/A    Medical Provider No    Screening Referrals Made N/A    Medical Referrals Made Non-[facility]    Medical Appointment Completed Non-[facility]    CNP Interventions Advocate/Support;Counsel;Educate;Spiritual [employer] System;Case Management    Screenings CN Performed N/A    ED Visit Averted N/A    Life-Saving Intervention Made N/A         Client to RN office for spiritual care. RN able to support client and provide spiritual care over concerns in his life. Client is going to see his therapist tomorrow and wanting medicine to assist with emotional state. RN will continue to follow and assist as needed.

## 2024-03-29 ENCOUNTER — Telehealth: Payer: MEDICAID | Admitting: Nurse Practitioner

## 2024-03-29 DIAGNOSIS — M79642 Pain in left hand: Secondary | ICD-10-CM | POA: Diagnosis not present
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
## 2024-04-18 NOTE — Congregational Nurse Program (Signed)
  Dept: [phone]   Congregational Nurse Program Note  Date of Encounter: 04/18/2024  Past Medical History: Past Medical History:  Diagnosis Date   ADHD    Leg fracture, left    Leg fracture, right     Encounter Details:  Community Questionnaire - 04/18/24 0930       Questionnaire   Ask client: Do you give verbal consent for me to treat you today? Yes    Student Assistance N/A    Location Patient Served  [facility]    Encounter Setting CN site    Population Status Unknown    Insurance Medicaid    Insurance/Financial Assistance Referral N/A    Medication N/A    Medical Provider No    Screening Referrals Made N/A    Medical Referrals Made Non-[facility]    Medical Appointment Completed Non-[facility]    CNP Interventions Advocate/Support;Counsel;Spiritual Care;Case Management    Screenings CN Performed N/A    ED Visit Averted N/A    Life-Saving Intervention Made N/A         Client to RN office for scheduled visit. Client is feeling overwhelmed and emotional about personal events and needing a safe space to vent and process. RN able to provide spiritual care and make referred client to see his therapist at [facility]. Client appreciative of conversation. RN helped with stress and anxiety management. No immediate concerns at this time. RN will continue to follow.

## 2024-04-22 NOTE — Congregational Nurse Program (Signed)
  Dept: [phone]   Congregational Nurse Program Note  Date of Encounter: 04/22/2024  Past Medical History: Past Medical History:  Diagnosis Date   ADHD    Leg fracture, left    Leg fracture, right     Encounter Details:  Community Questionnaire - 04/22/24 1300       Questionnaire   Ask client: Do you give verbal consent for me to treat you today? Yes    Student Assistance N/A    Location Patient Served  [facility]    Encounter Setting CN site    Population Status Unknown    Insurance Medicaid    Insurance/Financial Assistance Referral N/A    Medication N/A    Medical Provider Yes    Screening Referrals Made N/A    Medical Referrals Made [facility]    Medical Appointment Completed N/A    CNP Interventions Advocate/Support;Counsel;Spiritual Care;Navigate Healthcare System    Screenings CN Performed N/A    ED Visit Averted N/A    Life-Saving Intervention Made N/A         Client presented to my office today to talk through recent struggles and about feeling unwell. Client is complaining of nasal congestion, mucous, occasional cough and feeling hot. Temp 100.5 Discussed seeing NP. Client states he wants to go and talk with Ronal Caldron NP.

## 2024-04-23 ENCOUNTER — Other Ambulatory Visit: Payer: Self-pay

## 2024-04-23 ENCOUNTER — Telehealth: Payer: MEDICAID | Admitting: Nurse Practitioner

## 2024-04-23 DIAGNOSIS — J4521 Mild intermittent asthma with (acute) exacerbation: Secondary | ICD-10-CM

## 2024-04-23 MED ORDER — ALBUTEROL SULFATE HFA 108 (90 BASE) MCG/ACT IN AERS
2.0000 | INHALATION_SPRAY | Freq: Four times a day (QID) | RESPIRATORY_TRACT | 0 refills | Status: DC | PRN
  Filled 2024-04-23: qty 18, 25d supply, fill #0

## 2024-04-23 MED ORDER — PREDNISONE 20 MG PO TABS
20.0000 mg | ORAL_TABLET | Freq: Two times a day (BID) | ORAL | 0 refills | Status: AC
  Filled 2024-04-23: qty 10, 5d supply, fill #0

## 2024-04-23 NOTE — Progress Notes (Signed)
 Acute Video Visit    Virtual Visit Consent:   Robert Silva, you are scheduled for a virtual visit with a [facility] provider today.     Just as with appointments in the office, your consent must be obtained to participate.  Your consent will be active for this visit and any virtual visit you may have with one of our providers in the next 365 days.     If you have a MyChart account, a copy of this consent can be sent to you electronically.  All virtual visits are billed to your insurance company just like a traditional visit in the office.    If the connection with a video visit is poor, the visit may have to be switched to a telephone visit.  With either a video or telephone visit, we are not always able to ensure that we have a secure connection.     I need to obtain your verbal consent now.   Are you willing to proceed with your visit today?    Robert Silva has provided verbal consent on 04/23/2024 for a virtual visit (video or telephone).   Lauraine Kitty, FNP  Date: 04/23/2024 9:18 AM  Subjective:     Patient ID: Robert Silva, male    DOB: Mar 08, 2001, 23 y.o.   MRN: [id number]  [name], connected with  Robert Silva  ([id number], 2000-07-31) on 04/23/24 at 11:40 AM EST by a video-enabled telemedicine application and verified that I am speaking with the correct person using two identifiers.   Location: Patient: [facility]  Provider: Virtual Visit Location Provider: Home Office   I discussed the limitations of evaluation and management by telemedicine and the availability of in person appointments. The patient expressed understanding and agreed to proceed.     HPI Robert Silva is a 23 y.o. who identifies as a male who was assigned male at birth, and is being seen today with complaints of cough and wheezing with a productive cough.  Denies fever  History is significant for asthma and exercise induced bronchospasm but he does not have an inhaler at this time   He is currently un  housed and seeking his medical care at the [facility]   He has had similar symptoms in the past he relates to worsening allergies with weather changes  Denies other systemic symptoms      Objective:     Physical Exam Constitutional:      Appearance: Normal appearance.  HENT:     Head: Normocephalic.     Nose: Nose normal.  Pulmonary:     Effort: Pulmonary effort is normal.     Breath sounds: Wheezing present.  Neurological:     Mental Status: He is alert and oriented to person, place, and time.       Assessment & Plan:    Patient to follow up with PCP in December at [facility]  Encouraged to use [facility] for acute needs while awaiting appointment  May schedule follow up if symptoms persist or with new concerns    1. Mild intermittent asthma with exacerbation (Primary) - predniSONE  (DELTASONE ) 20 MG tablet; Take 1 tablet (20 mg total) by mouth 2 (two) times daily with a meal for 5 days.  Dispense: 10 tablet; Refill: 0 - albuterol  (VENTOLIN  HFA) 108 (90 Base) MCG/ACT inhaler; Inhale 2 puffs into the lungs every 6 (six) hours as needed for wheezing or shortness of breath.  Dispense: 8 g; Refill: 0    Follow Up Instructions: I  discussed the assessment and treatment plan with the patient. The patient was provided an opportunity to ask questions and all were answered. The patient agreed with the plan and demonstrated an understanding of the instructions.  A copy of instructions were sent to the patient via MyChart unless otherwise noted below.    The patient was advised to call back or seek an in-person evaluation if the symptoms worsen or if the condition fails to improve as anticipated.    Lauraine Kitty, FNP  **Disclaimer: This note may have been dictated with voice recognition software. Similar sounding words can inadvertently be transcribed and this note may contain transcription errors which may not have been corrected upon publication of note.**

## 2024-04-23 NOTE — Congregational Nurse Program (Signed)
  Dept: [phone]   Congregational Nurse Program Note  Date of Encounter: 04/23/2024  Past Medical History: Past Medical History:  Diagnosis Date   ADHD    Leg fracture, left    Leg fracture, right     Encounter Details:  Community Questionnaire - 04/23/24 0915       Questionnaire   Ask client: Do you give verbal consent for me to treat you today? Yes    Student Assistance N/A    Location Patient Served  [facility]    Encounter Setting CN site    Population Status Unknown    Insurance Medicaid    Insurance/Financial Assistance Referral N/A    Medication N/A    Medical Provider Yes    Screening Referrals Made N/A    Medical Referrals Made [facility]    Medical Appointment Completed N/A    CNP Interventions Advocate/Support;Counsel;Spiritual Care;Navigate Healthcare System    Screenings CN Performed N/A    ED Visit Averted N/A    Life-Saving Intervention Made N/A         Client to RN office for scheduled visit. Client is feeling overwhelmed and emotional about personal events and needing a safe space to vent and process. RN able to provide spiritual care and suggested establishing relationship with new therapist as he says old therapist is no longer seeing patients. Client appreciative of conversation. RN helped with stress and anxiety management. No immediate concerns at this time. RN will continue to follow.

## 2024-04-30 NOTE — Congregational Nurse Program (Signed)
  Dept: [phone]   Congregational Nurse Program Note  Date of Encounter: 04/30/2024  Past Medical History: Past Medical History:  Diagnosis Date   ADHD    Leg fracture, left    Leg fracture, right     Encounter Details:  Community Questionnaire - 04/30/24 0915       Questionnaire   Ask client: Do you give verbal consent for me to treat you today? Yes    Student Assistance N/A    Location Patient Served  [facility]    Encounter Setting CN site    Population Status Unknown    Insurance Medicaid    Insurance/Financial Assistance Referral N/A    Medication N/A    Medical Provider Yes    Screening Referrals Made N/A    Medical Referrals Made [facility]    Medical Appointment Completed N/A    CNP Interventions Advocate/Support;Counsel;Spiritual Care;Navigate Healthcare System    Screenings CN Performed N/A    ED Visit Averted N/A    Life-Saving Intervention Made N/A         Client is feeling overwhelmed and emotional about personal events and needing a safe space to vent and process. RN able to provide spiritual care and again suggested establishing relationship with new therapist as he says old therapist is no longer seeing patients. Client appreciative of conversation. RN helped with stress and anxiety management. No immediate concerns at this time. RN will continue to follow.

## 2024-04-30 NOTE — Congregational Nurse Program (Signed)
  Dept: [phone]   Congregational Nurse Program Note  Date of Encounter: 04/30/2024  Past Medical History: Past Medical History:  Diagnosis Date   ADHD    Leg fracture, left    Leg fracture, right     Encounter Details:  Community Questionnaire - 04/30/24 0915       Questionnaire   Ask client: Do you give verbal consent for me to treat you today? Yes    Student Assistance N/A    Location Patient Served  [facility]    Encounter Setting CN site    Population Status Unknown    Insurance Medicaid    Insurance/Financial Assistance Referral N/A    Medication N/A    Medical Provider Yes    Screening Referrals Made N/A    Medical Referrals Made [facility]    Medical Appointment Completed N/A    CNP Interventions Advocate/Support;Counsel;Spiritual Care;Navigate Healthcare System    Screenings CN Performed N/A    ED Visit Averted N/A    Life-Saving Intervention Made N/A          Client is feeling overwhelmed and emotional about personal events and needing a safe space to vent and process. RN able to provide spiritual care and discussed again establishing relationship with new therapist as he says old therapist is no longer seeing patients. Client appreciative of conversation. RN helped with stress and anxiety management. No immediate concerns at this time. RN will continue to follow.

## 2024-05-01 NOTE — Congregational Nurse Program (Signed)
  Dept: [phone]   Congregational Nurse Program Note  Date of Encounter: 05/01/2024  Past Medical History: Past Medical History:  Diagnosis Date   ADHD    Leg fracture, left    Leg fracture, right     Encounter Details:  Community Questionnaire - 05/01/24 0945       Questionnaire   Ask client: Do you give verbal consent for me to treat you today? Yes    Student Assistance N/A    Location Patient Served  [facility]    Encounter Setting CN site    Population Status Unknown    Insurance Medicaid    Insurance/Financial Assistance Referral N/A    Medication N/A    Medical Provider Yes    Screening Referrals Made N/A    Medical Referrals Made N/A    Medical Appointment Completed N/A    CNP Interventions Advocate/Support;Counsel;Spiritual Care;Navigate Healthcare System;Educate    Screenings CN Performed N/A    ED Visit Averted N/A    Life-Saving Intervention Made N/A          Client requesting to speak to RN. He is frustrated and overwhelmed. Client shared about recent life experiences that have been affecting his mental health significantly. RN continues to reinforce the benefits of a mental health therapist to help him function emotionally in healthy ways. Learn how to cope and deal with difficult emotions so he would feel more stable and not emotionally fragile. RN is able to actively and empathetically listen and offer therapeutic communication and presence. Client has no further concerns. Emotional and Spiritual support provided.

## 2024-05-02 ENCOUNTER — Other Ambulatory Visit: Payer: Self-pay

## 2024-05-02 NOTE — Congregational Nurse Program (Signed)
  Dept: [phone]   Congregational Nurse Program Note  Date of Encounter: 05/02/2024  Past Medical History: Past Medical History:  Diagnosis Date   ADHD    Leg fracture, left    Leg fracture, right     Encounter Details:  Community Questionnaire - 05/02/24 1320       Questionnaire   Ask client: Do you give verbal consent for me to treat you today? Yes    Student Assistance N/A    Location Patient Served  [facility]    Encounter Setting CN site    Population Status Unknown    Insurance Medicaid    Insurance/Financial Assistance Referral N/A    Medication N/A    Medical Provider Yes    Screening Referrals Made N/A    Medical Referrals Made N/A    Medical Appointment Completed N/A    CNP Interventions Advocate/Support;Counsel;Spiritual Care;Navigate Healthcare System;Educate    Screenings CN Performed N/A    ED Visit Averted N/A    Life-Saving Intervention Made N/A                   Client to RN office for support. Spiritual care was provided through the [facility], active listening and therapeutic communication. Client expressed that he is currently facing significant life challenges and was offered space to share openly without judgement. Focus of visit was on emotional and spiritual support, not problem solving. RN offered encouragement and affirmed client's resilience and dignity. Resources discussed as appropriate. No immediate safety concerns noted. Client appeared receptive and expressed appreciation for the time and space provided.

## 2024-05-06 ENCOUNTER — Other Ambulatory Visit: Payer: Self-pay

## 2024-05-06 ENCOUNTER — Ambulatory Visit (HOSPITAL_COMMUNITY)
Admission: EM | Admit: 2024-05-06 | Discharge: 2024-05-06 | Disposition: A | Discharge status: Other Acute Inpatient Hospital | Payer: MEDICAID | Attending: Family | Admitting: Family

## 2024-05-06 ENCOUNTER — Inpatient Hospital Stay (HOSPITAL_COMMUNITY)
Admission: AD | Admit: 2024-05-06 | Discharge: 2024-05-10 | DRG: 885 | Disposition: A | Payer: MEDICAID | Source: Intra-hospital

## 2024-05-06 ENCOUNTER — Encounter (HOSPITAL_COMMUNITY): Payer: Self-pay | Admitting: Family

## 2024-05-06 DIAGNOSIS — Z8669 Personal history of other diseases of the nervous system and sense organs: Secondary | ICD-10-CM | POA: Diagnosis not present

## 2024-05-06 DIAGNOSIS — J4520 Mild intermittent asthma, uncomplicated: Secondary | ICD-10-CM | POA: Diagnosis not present

## 2024-05-06 DIAGNOSIS — R45850 Homicidal ideations: Secondary | ICD-10-CM

## 2024-05-06 DIAGNOSIS — F1220 Cannabis dependence, uncomplicated: Secondary | ICD-10-CM | POA: Diagnosis not present

## 2024-05-06 DIAGNOSIS — F322 Major depressive disorder, single episode, severe without psychotic features: Principal | ICD-10-CM | POA: Diagnosis present

## 2024-05-06 DIAGNOSIS — F4324 Adjustment disorder with disturbance of conduct: Secondary | ICD-10-CM

## 2024-05-06 DIAGNOSIS — Z8659 Personal history of other mental and behavioral disorders: Secondary | ICD-10-CM | POA: Diagnosis not present

## 2024-05-06 DIAGNOSIS — F17210 Nicotine dependence, cigarettes, uncomplicated: Secondary | ICD-10-CM

## 2024-05-06 DIAGNOSIS — R45851 Suicidal ideations: Secondary | ICD-10-CM

## 2024-05-06 LAB — COMPREHENSIVE METABOLIC PANEL WITH GFR
ALT: 32 U/L (ref 0–44)
AST: 27 U/L (ref 15–41)
Albumin: 4.1 g/dL (ref 3.5–5.0)
Alkaline Phosphatase: 80 U/L (ref 38–126)
Anion gap: 9 (ref 5–15)
BUN: 9 mg/dL (ref 6–20)
CO2: 30 mmol/L (ref 22–32)
Calcium: 9.3 mg/dL (ref 8.9–10.3)
Chloride: 100 mmol/L (ref 98–111)
Creatinine, Ser: 0.63 mg/dL (ref 0.61–1.24)
GFR, Estimated: >60 mL/min (ref >60)
Glucose, Bld: 78 mg/dL (ref 70–99)
Potassium: 4.1 mmol/L (ref 3.5–5.1)
Sodium: 139 mmol/L (ref 135–145)
Total Bilirubin: 1.1 mg/dL (ref 0.0–1.2)
Total Protein: 8.1 g/dL (ref 6.5–8.1)

## 2024-05-06 LAB — CBC WITH DIFFERENTIAL/PLATELET
Abs Immature Granulocytes: 0.01 K/uL (ref 0.00–0.07)
Basophils Absolute: 0 K/uL (ref 0.0–0.1)
Basophils Relative: 1 %
Eosinophils Absolute: 0.2 K/uL (ref 0.0–0.5)
Eosinophils Relative: 2 %
HCT: 38.3 % — ABNORMAL LOW (ref 39.0–52.0)
Hemoglobin: 12.6 g/dL — ABNORMAL LOW (ref 13.0–17.0)
Immature Granulocytes: 0 %
Lymphocytes Relative: 42 %
Lymphs Abs: 2.6 K/uL (ref 0.7–4.0)
MCH: 30.3 pg (ref 26.0–34.0)
MCHC: 32.9 g/dL (ref 30.0–36.0)
MCV: 92.1 fL (ref 80.0–100.0)
Monocytes Absolute: 0.4 K/uL (ref 0.1–1.0)
Monocytes Relative: 7 %
Neutro Abs: 3 K/uL (ref 1.7–7.7)
Neutrophils Relative %: 48 %
Platelets: 290 K/uL (ref 150–400)
RBC: 4.16 MIL/uL — ABNORMAL LOW (ref 4.22–5.81)
RDW: 12.9 % (ref 11.5–15.5)
WBC: 6.3 K/uL (ref 4.0–10.5)
nRBC: 0 % (ref 0.0–0.2)

## 2024-05-06 LAB — POCT URINE DRUG SCREEN - MANUAL ENTRY (I-SCREEN)
POC Amphetamine UR: NOT DETECTED
POC Buprenorphine (BUP): NOT DETECTED
POC Cocaine UR: NOT DETECTED
POC Marijuana UR: POSITIVE — AB
POC Methadone UR: NOT DETECTED
POC Methamphetamine UR: NOT DETECTED
POC Morphine: NOT DETECTED
POC Oxazepam (BZO): NOT DETECTED
POC Oxycodone UR: NOT DETECTED
POC Secobarbital (BAR): NOT DETECTED

## 2024-05-06 LAB — MAGNESIUM: Magnesium: 1.9 mg/dL (ref 1.7–2.4)

## 2024-05-06 LAB — TSH: TSH: 1.757 u[IU]/mL (ref 0.350–4.500)

## 2024-05-06 LAB — ETHANOL: Alcohol, Ethyl (B): <15 mg/dL (ref <15)

## 2024-05-06 MED ORDER — ACETAMINOPHEN 325 MG PO TABS: 650.0000 mg | ORAL_TABLET | Freq: Four times a day (QID) | ORAL | Status: DC | PRN

## 2024-05-06 MED ORDER — DIPHENHYDRAMINE HCL 50 MG/ML IJ SOLN: 50.0000 mg | Freq: Three times a day (TID) | INTRAMUSCULAR | Status: DC | PRN

## 2024-05-06 MED ORDER — DIPHENHYDRAMINE HCL 25 MG PO CAPS
50.0000 mg | ORAL_CAPSULE | Freq: Three times a day (TID) | ORAL | Status: DC | PRN
  Administered 2024-05-07: 50 mg via ORAL
  Filled 2024-05-06: qty 2

## 2024-05-06 MED ORDER — NICOTINE 21 MG/24HR TD PT24: 21.0000 mg | MEDICATED_PATCH | Freq: Every day | TRANSDERMAL | Status: DC

## 2024-05-06 MED ORDER — LORAZEPAM 2 MG/ML IJ SOLN: 2.0000 mg | Freq: Three times a day (TID) | INTRAMUSCULAR | Status: DC | PRN

## 2024-05-06 MED ORDER — HALOPERIDOL LACTATE 5 MG/ML IJ SOLN: 5.0000 mg | Freq: Three times a day (TID) | INTRAMUSCULAR | Status: DC | PRN

## 2024-05-06 MED ORDER — TRAZODONE HCL 50 MG PO TABS
50.0000 mg | ORAL_TABLET | Freq: Every evening | ORAL | Status: DC | PRN
  Administered 2024-05-06 – 2024-05-09 (×4): 50 mg via ORAL
  Filled 2024-05-06: qty 7
  Filled 2024-05-06 (×4): qty 1

## 2024-05-06 MED ORDER — HYDROXYZINE HCL 25 MG PO TABS
25.0000 mg | ORAL_TABLET | Freq: Three times a day (TID) | ORAL | Status: DC | PRN
  Administered 2024-05-06 – 2024-05-09 (×4): 25 mg via ORAL
  Filled 2024-05-06: qty 1
  Filled 2024-05-06: qty 10
  Filled 2024-05-06 (×3): qty 1

## 2024-05-06 MED ORDER — HALOPERIDOL 5 MG PO TABS
5.0000 mg | ORAL_TABLET | Freq: Three times a day (TID) | ORAL | Status: DC | PRN
  Administered 2024-05-07: 5 mg via ORAL
  Filled 2024-05-06: qty 1

## 2024-05-06 MED ORDER — DIPHENHYDRAMINE HCL 50 MG PO CAPS: 50.0000 mg | ORAL_CAPSULE | Freq: Three times a day (TID) | ORAL | Status: DC | PRN

## 2024-05-06 MED ORDER — HALOPERIDOL 5 MG PO TABS: 5.0000 mg | ORAL_TABLET | Freq: Three times a day (TID) | ORAL | Status: DC | PRN

## 2024-05-06 MED ORDER — HYDROXYZINE HCL 25 MG PO TABS: 25.0000 mg | ORAL_TABLET | Freq: Three times a day (TID) | ORAL | Status: DC | PRN

## 2024-05-06 MED ORDER — ENSURE PLUS HIGH PROTEIN PO LIQD
237.0000 mL | Freq: Two times a day (BID) | ORAL | Status: DC
  Administered 2024-05-07 – 2024-05-10 (×6): 237 mL via ORAL
  Filled 2024-05-06 (×9): qty 237

## 2024-05-06 MED ORDER — TRAZODONE HCL 50 MG PO TABS: 50.0000 mg | ORAL_TABLET | Freq: Every evening | ORAL | Status: DC | PRN

## 2024-05-06 MED ORDER — HALOPERIDOL LACTATE 5 MG/ML IJ SOLN: 10.0000 mg | Freq: Three times a day (TID) | INTRAMUSCULAR | Status: DC | PRN

## 2024-05-06 MED ORDER — MAGNESIUM HYDROXIDE 400 MG/5ML PO SUSP: 30.0000 mL | Freq: Every day | ORAL | Status: DC | PRN

## 2024-05-06 MED ORDER — NICOTINE 21 MG/24HR TD PT24
21.0000 mg | MEDICATED_PATCH | Freq: Every day | TRANSDERMAL | Status: DC
  Administered 2024-05-06: 21 mg via TRANSDERMAL
  Filled 2024-05-06: qty 1

## 2024-05-06 MED ORDER — ALUM & MAG HYDROXIDE-SIMETH 200-200-20 MG/5ML PO SUSP: 30.0000 mL | ORAL | Status: DC | PRN

## 2024-05-06 NOTE — ED Notes (Addendum)
 Patient is transferring to [facility] at this time via safe transport. Voluntary consent, EMTALA, and other transfer paperwork sent with patient and provided to transport. Patient A&OX4. Calm and cooperative at time of transport. Report provided to [facility], [facility]. All valuables/belongings sent with patient. Patient in no current distress.

## 2024-05-06 NOTE — Progress Notes (Signed)
 Pt has been accepted to [facility] on 05/06/2024 Bed assignment: 305-01  Pt meets inpatient criteria per: Ellouise Dawn NP  Attending Physician will be: Dr. Raliegh MD   Report can be called un:lwpu: Adult unit: [phone]  Pt can arrive after pending labs, vol, UDS, EKG   Care Team Notified: [facility] Cherylynn Ernst RN, Ellouise Dawn NP, Clement Gerold RN  [place] Clerance Umland LCSW-A   05/06/2024 1:38 PM

## 2024-05-06 NOTE — [facility] Group Notes (Signed)
 The focus of this group is to help patients review their daily goal of treatment and discuss progress on daily workbooks. Pt was attentive and appropriate during tonight's wrap up group discussion with aa.

## 2024-05-06 NOTE — Progress Notes (Signed)
 Admission Note: Received patient and walked to search room. Patient vitals are within normal limits. He has allergy to fish and nuts paper submitted to cafeteria. Consents signed and belongings placed in locker. Patient admits to past childhood trauma but refused to discuss it. He reports having anger issues and said he wouldn't fight in here unless someone disrespects him. Nurse during that time asked about coping skills and he reported he does not have any because [place]. Patient is SI and reports feeling like that constantly due to  how terrible my life is He said the only person he has is his grandma Gattis Sora his foster mother died and his biological mother didn't take full care of him. Patient states having ADHD and dyslexia. During my assessment patient is hyperactivity and does not understand boundaries. He reports using THC, Cocaine, and opiates in pill form.  Patient is childlike in behavior with loud and rapid speech. He reports being un housed not having the necessary things to survive. He denies AVH. Skin assessment completed and is intact. Patient would like help with medication management and anger management. He reports being homicidal but would not go into detail about it. At this moment he verbalizes his safety and not harming others.

## 2024-05-06 NOTE — Progress Notes (Signed)
   05/06/24 2300  Psych Admission Type (Psych Patients Only)  Admission Status Voluntary  Psychosocial Assessment  Patient Complaints Anxiety;Depression  Eye Contact Fair  Facial Expression Animated  Affect Euphoric  Speech Loud;Rapid  Interaction Childlike  Motor Activity Fidgety  Appearance/Hygiene Disheveled;Body odor  Behavior Characteristics Impulsive;Hyperactive  Mood Depressed;Euphoric  Thought Process  Coherency Tangential  Content Blaming others  Delusions None reported or observed  Perception WDL  Hallucination None reported or observed  Judgment Limited  Confusion None  Danger to Self  Current suicidal ideation? Passive  Description of Suicide Plan if i was to leave i will shoot myself  Agreement Not to Harm Self Yes  Description of Agreement Verbal  Danger to Others  Danger to Others None reported or observed

## 2024-05-06 NOTE — ED Notes (Signed)
 Patient presents calm during admission process and is aware of pending [facility] admission. Patient A&Ox4. Independent with ADLS. Patient currently endorses passive SI and HI but denies A/V/H. Patient oriented to unit/unit rules and received his nicotine  patch. Patient denies any current pain and remains cooperative in unit at this time.
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
## 2024-05-06 NOTE — Congregational Nurse Program (Signed)
  Dept: [phone]   Congregational Nurse Program Note  Date of Encounter: 05/06/2024  Past Medical History: Past Medical History:  Diagnosis Date   ADHD    Leg fracture, left    Leg fracture, right     Encounter Details:  Community Questionnaire - 05/06/24 0936       Questionnaire   Ask client: Do you give verbal consent for me to treat you today? Yes    Student Assistance N/A    Location Patient Served  [facility]    Encounter Setting CN site    Population Status Unknown    Insurance Medicaid    Insurance/Financial Assistance Referral N/A    Medication N/A    Medical Provider Yes    Screening Referrals Made N/A    Medical Referrals Made N/A    Medical Appointment Completed N/A    CNP Interventions Advocate/Support;Counsel;Spiritual Care;Navigate Healthcare System;Educate    Screenings CN Performed N/A    ED Visit Averted N/A    Life-Saving Intervention Made N/A          Client presented to RN office this morning for his [employer] ID number. Number written down for client. Client to come back later this morning for us  to further discuss plans to establish with a therapist. No other acute needs at this time.

## 2024-05-06 NOTE — Tx Team (Signed)
 Initial Treatment Plan 05/06/2024 6:54 PM [name] FMW:[id number]    PATIENT STRESSORS: Other: people     PATIENT STRENGTHS: Other: don't have none   PATIENT IDENTIFIED PROBLEMS: I just don't like people and my life is terrible.                     DISCHARGE CRITERIA:  Ability to meet basic life and health needs  PRELIMINARY DISCHARGE PLAN:    PATIENT/FAMILY INVOLVEMENT: This treatment plan has been presented to and reviewed with the patient, Robert Silva.  The patient and family have been given the opportunity to ask questions and make suggestions.  Elouise Wolm Morel, RN 05/06/2024, 6:54 PM
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
## 2024-05-06 NOTE — Progress Notes (Signed)
   05/06/24 1227  [place] Suicide Severity Rating Scale  1. In the past month -  Have you wished you were dead or wished you could go to sleep and not wake up? Yes  2. In the past month - Have you actually had any thoughts of killing yourself? Yes  3. In the past month - Have you been thinking about how you might kill yourself? Yes  4. In the past month - Have you had these thoughts and had some intention of acting on them? Yes  5. In the past month - Have you started to work out or worked out the details of how to kill yourself? Do you intend to carry out this plan? Yes  6. Have you ever done anything, started to do anything, or prepared to do anything to end your life? Yes  7. Was this within the past three months? Yes  C-SSRS RISK CATEGORY High Risk  Patient location: [facility] Crisis [facility]  [facility] Urgent Care/[facility] Crisis [facility] Suicide Precautions Interventions  [facility] Suicide Precautions Interventions High Risk Interventions
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
## 2024-05-07 DIAGNOSIS — J4520 Mild intermittent asthma, uncomplicated: Secondary | ICD-10-CM

## 2024-05-07 DIAGNOSIS — F322 Major depressive disorder, single episode, severe without psychotic features: Principal | ICD-10-CM

## 2024-05-07 DIAGNOSIS — F1220 Cannabis dependence, uncomplicated: Secondary | ICD-10-CM

## 2024-05-07 DIAGNOSIS — Z8669 Personal history of other diseases of the nervous system and sense organs: Secondary | ICD-10-CM

## 2024-05-07 LAB — HIV ANTIBODY (ROUTINE TESTING W REFLEX): HIV Screen 4th Generation wRfx: NONREACTIVE

## 2024-05-07 MED ORDER — LEVETIRACETAM ER 500 MG PO TB24
500.0000 mg | ORAL_TABLET | Freq: Every day | ORAL | Status: DC
  Administered 2024-05-07 – 2024-05-10 (×4): 500 mg via ORAL
  Filled 2024-05-07 (×4): qty 1
  Filled 2024-05-07: qty 7

## 2024-05-07 MED ORDER — NICOTINE POLACRILEX 2 MG MT GUM
2.0000 mg | CHEWING_GUM | OROMUCOSAL | Status: DC | PRN
  Filled 2024-05-07: qty 1

## 2024-05-07 MED ORDER — ALBUTEROL SULFATE HFA 108 (90 BASE) MCG/ACT IN AERS: 1.0000 | INHALATION_SPRAY | RESPIRATORY_TRACT | Status: DC | PRN

## 2024-05-07 MED ORDER — NICOTINE 14 MG/24HR TD PT24
14.0000 mg | MEDICATED_PATCH | Freq: Every day | TRANSDERMAL | Status: DC
  Administered 2024-05-07 – 2024-05-10 (×3): 14 mg via TRANSDERMAL
  Filled 2024-05-07 (×3): qty 1

## 2024-05-07 MED ORDER — SERTRALINE HCL 50 MG PO TABS
50.0000 mg | ORAL_TABLET | Freq: Every day | ORAL | Status: DC
  Administered 2024-05-07 – 2024-05-10 (×4): 50 mg via ORAL
  Filled 2024-05-07: qty 1
  Filled 2024-05-07: qty 7
  Filled 2024-05-07 (×3): qty 1

## 2024-05-07 NOTE — Group Note (Signed)
 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    Participation Level:  Did Not Attend  Robert Silva 05/07/2024, 11:57 AM

## 2024-05-07 NOTE — Progress Notes (Signed)
   05/07/24 0800  Psych Admission Type (Psych Patients Only)  Admission Status Voluntary  Psychosocial Assessment  Patient Complaints Anxiety;Depression  Eye Contact Fair  Facial Expression Animated  Affect Euphoric  Speech Logical/coherent  Interaction Childlike;Attention-seeking  Motor Activity Fidgety  Appearance/Hygiene Improved  Behavior Characteristics Hyperactive;Impulsive  Mood Anxious;Euphoric  Aggressive Behavior  Targets Other (Comment) (na)  Type of Behavior Other (Comment) (na)  Effect No apparent injury  Thought Process  Coherency Tangential  Content Blaming others  Delusions None reported or observed  Perception WDL  Hallucination None reported or observed  Judgment Limited  Confusion None  Danger to Self  Current suicidal ideation? Denies  Description of Suicide Plan na  Self-Injurious Behavior No self-injurious ideation or behavior indicators observed or expressed   Agreement Not to Harm Self Yes  Description of Agreement Verbal  Danger to Others  Danger to Others None reported or observed

## 2024-05-07 NOTE — Plan of Care (Signed)
  Problem: Activity: Goal: Interest or engagement in activities will improve Outcome: Progressing   Problem: Education: Goal: Emotional status will improve Outcome: Progressing   Problem: Education: Goal: Knowledge of [facility] Education information/materials will improve Outcome: Progressing

## 2024-05-07 NOTE — Group Note (Signed)
 Date:  05/07/2024 Time:  5:38 PM  Group Topic/Focus:  Emotional Education:   The focus of this group is to discuss what feelings/emotions are, and how they are experienced. Overcoming Stress:   The focus of this group is to define stress and help patients assess their triggers.    Participation Level:  Active  Participation Quality:  Appropriate  Affect:  Appropriate  Cognitive:  Appropriate  Insight: Appropriate  Engagement in Group:  Engaged  Modes of Intervention:  Activity  Additional Comments:    [name] Robert Silva 05/07/2024, 5:38 PM

## 2024-05-07 NOTE — [facility] Suicide Risk Assessment (Incomplete)
 Suicide Risk Assessment  Admission Assessment    [facility] Admission Suicide Risk Assessment   Nursing information obtained from:  Patient Demographic factors:  Male, Low socioeconomic status, Unemployed Current Mental Status:  Suicidal ideation indicated by patient Loss Factors:  Financial problems / change in socioeconomic status Historical Factors:  Prior suicide attempts, Impulsivity Risk Reduction Factors:  NA  Total Time spent with patient: {Time; 15 min - 8 hours:17441} Principal Problem: MDD (major depressive disorder), single episode, severe (HCC) Diagnosis:  Principal Problem:   MDD (major depressive disorder), single episode, severe (HCC)  Subjective Data: Robert Silva is a 23 year old male     Continued Clinical Symptoms:  Alcohol Use Disorder Identification Test Final Score (AUDIT): 0 The Alcohol Use Disorders Identification Test, Guidelines for Use in Primary Care, Second Edition.  World Science Writer [facility]). Score between 0-7:  no or low risk or alcohol related problems. Score between 8-15:  moderate risk of alcohol related problems. Score between 16-19:  high risk of alcohol related problems. Score 20 or above:  warrants further diagnostic evaluation for alcohol dependence and treatment.   CLINICAL FACTORS:   {Clinical Factors:22706}   Musculoskeletal: Strength & Muscle Tone: within normal limits Gait & Station: normal Patient leans: N/A  Psychiatric Specialty Exam:  Presentation  General Appearance:  Appropriate for Environment; Casual  Eye Contact: Good  Speech: Clear and Coherent; Normal Rate  Speech Volume: Normal  Handedness: Right   Mood and Affect  Mood: Depressed  Affect: Depressed   Thought Process  Thought Processes: Coherent; Goal Directed; Linear  Descriptions of Associations:Intact  Orientation:Full (Time, Place and Person)  Thought Content:Logical  History of Schizophrenia/Schizoaffective  disorder:No  Duration of Psychotic Symptoms:No data recorded Hallucinations:Hallucinations: None  Ideas of Reference:None  Suicidal Thoughts:Suicidal Thoughts: Yes, Active SI Active Intent and/or Plan: With Plan  Homicidal Thoughts:Homicidal Thoughts: Yes, Active HI Active Intent and/or Plan: With Plan; With Intent   Sensorium  Memory: Immediate Good; Recent Fair  Judgment: Impaired  Insight: Lacking   Executive Functions  Concentration: Fair  Attention Span: Fair  Recall: Fair  Fund of Knowledge: Good  Language: Good   Psychomotor Activity  Psychomotor Activity: Psychomotor Activity: Normal   Assets  Assets: Communication Skills; Desire for Improvement; Financial Resources/Insurance   Sleep  Sleep: Sleep: Fair    Physical Exam: Physical Exam ROS Blood pressure 111/77, pulse 78, temperature 98 F (36.7 C), temperature source Oral, resp. rate 18, height 5' 7 (1.702 m), weight 75.6 kg, SpO2 100%. Body mass index is 26.09 kg/m.   COGNITIVE FEATURES THAT CONTRIBUTE TO RISK:  Polarized thinking    SUICIDE RISK:   {[facility] SUICIDE RISK:22704}  PLAN OF CARE: See H&P for assessment and plan.  I certify that inpatient services furnished can reasonably be expected to improve the patient's condition.   Blair Chiquita Hint, NP 05/07/2024, 6:44 AM

## 2024-05-07 NOTE — Group Note (Signed)
 Date:  05/07/2024 Time:  9:46 AM  Group Topic/Focus: Coping skills orientation group assessment Coping With Mental Health Crisis:   The purpose of this group is to help patients identify strategies for coping with mental health crisis.  Group discusses possible causes of crisis and ways to manage them effectively. We also created a fun way to practice managing stress, emotions, and challenges! You could approach this in different ways depending on whether you're playing alone, with others, or looking to use it as a tool for therapy or personal growth.    Participation Level:  Active  Participation Quality:  Appropriate  Affect:  Appropriate  Cognitive:  Alert and Appropriate  Insight: Appropriate  Engagement in Group:  Engaged  Modes of Intervention:  Discussion and Orientation  Additional Comments:  Patient attend  Robert Silva 05/07/2024, 9:46 AM

## 2024-05-07 NOTE — Group Note (Signed)
 Date:  05/07/2024 Time:  1:09 PM  Group Topic/Focus: [facility] Coping With Mental Health Crisis:   The purpose of this group is to help patients identify strategies for coping with mental health crisis.  Group discusses possible causes of crisis and ways to manage them effectively. Crisis Planning:   The purpose of this group is to help patients create a crisis plan for use upon discharge or in the future, as needed.    Participation Level:  Did Not Attend   Dolores CHRISTELLA Fredericks 05/07/2024, 1:09 PM

## 2024-05-07 NOTE — Progress Notes (Signed)
(  Sleep Hours) -11  (Any PRNs that were needed, meds refused, or side effects to meds)- hydroxyzine  25mg ; Trazodone  50mg   (Any disturbances and when (visitation, over night)-none  (Concerns raised by the patient)- none  (SI/HI/AVH)-denies

## 2024-05-07 NOTE — Plan of Care (Signed)
   Problem: Education: Goal: Emotional status will improve Outcome: Progressing Goal: Mental status will improve Outcome: Progressing   Problem: Activity: Goal: Interest or engagement in activities will improve Outcome: Progressing

## 2024-05-07 NOTE — Plan of Care (Signed)
   Problem: Education: Goal: Knowledge of [place] [facility] Education information/materials will improve Outcome: Progressing Goal: Emotional status will improve Outcome: Progressing Goal: Mental status will improve Outcome: Progressing

## 2024-05-07 NOTE — Progress Notes (Signed)
(  Sleep Hours) - 8  (Any PRNs that were needed, meds refused, or side effects to meds)- atarax  25mg , trazodone  50mg , no meds refused, no side effects to meds  (Any disturbances and when (visitation, over night)- n/a  (Concerns raised by the patient)- n/a  (SI/HI/AVH)- passive si, denies hi/avh

## 2024-05-07 NOTE — Group Note (Signed)
 Date:  05/07/2024 Time:  10:05 AM  Group Topic/Focus: Pet therapy Pet therapy, or animal-assisted therapy, has become an increasingly popular and effective method for improving mental health in adults. The presence of animals can provide emotional support, reduce stress, and enhance overall well-being.    Participation Level:  Active  Caralina Nop M Hendricks Schwandt 05/07/2024, 10:05 AM

## 2024-05-07 NOTE — Group Note (Signed)
 Recreation Therapy Group Note   Group Topic:Animal Assisted Therapy   Group Date: 05/07/2024 Start Time: 0945 End Time: 1030 Facilitators: Xyler Terpening-McCall, LRT,CTRS Location: [address] Dayroom   Animal-Assisted Activity (AAA) Program Checklist/Progress Notes Patient Eligibility Criteria Checklist & Daily Group note for Rec Tx Intervention  AAA/T Program Assumption of Risk Form signed by Patient/ or Parent Legal Guardian Yes  Patient is free of allergies or severe asthma Yes  Patient reports no fear of animals Yes  Patient reports no history of cruelty to animals Yes  Patient understands his/her participation is voluntary Yes  Patient washes hands before animal contact Yes  Patient washes hands after animal contact Yes  Behavioral Response: Engaged   Education: Charity Fundraiser, Appropriate Animal Interaction   Education Outcome: Acknowledges education.    Affect/Mood: Appropriate   Participation Level: Engaged   Participation Quality: Independent   Behavior: Appropriate   Speech/Thought Process: Focused   Insight: Good   Judgement: Good   Modes of Intervention: Teaching Laboratory Technician   Patient Response to Interventions:  Engaged   Education Outcome:  In group clarification offered    Clinical Observations/Individualized Feedback: Patient attended session and interacted appropriately with therapy dog and peers. Patient asked appropriate questions about therapy dog and his training. Patient shared stories about their pets at home with group.    Plan: Continue to engage patient in RT group sessions 2-3x/week.   Ronell Duffus-McCall, LRT,CTRS 05/07/2024 12:27 PM
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
## 2024-05-07 NOTE — H&P (Incomplete)
 Psychiatric Admission Assessment Adult  Patient Identification: Robert Silva MRN:  [id number] Date of Evaluation:  05/07/2024 Chief Complaint:  MDD (major depressive disorder), single episode, severe (HCC) [F32.2] Principal Diagnosis: MDD (major depressive disorder), single episode, severe (HCC) Diagnosis:  Principal Problem:   MDD (major depressive disorder), single episode, severe (HCC)  History of Present Illness: Robert Silva is a 23 year old male   ADHD - Focalin, Adderall (2022)  Escitalopram Oxalate 10 mg Tab (2023)  Asthma (intermittent)    Evaluation on Unit: The patient    New lab orders: A1c, Lipid panel, Vitamin D, RPR   Associated Signs/Symptoms: Depression Symptoms:  {DEPRESSION SYMPTOMS:20000} (Hypo) Manic Symptoms:  {B[facility] MANIC SYMPTOMS:22872} Anxiety Symptoms:  {B[facility] ANXIETY SYMPTOMS:22873} Psychotic Symptoms:  {BHH PSYCHOTIC SYMPTOMS:22874} PTSD Symptoms: {B[facility] PTSD SYMPTOMS:22875} Total Time spent with patient: {Time; 15 min - 8 hours:17441}  Past Psychiatric History: ***  Is the patient at risk to self? {yes no:314532}  Has the patient been a risk to self in the past 6 months? {yes no:314532}  Has the patient been a risk to self within the distant past? {yes no:314532}  Is the patient a risk to others? {yes no:314532}  Has the patient been a risk to others in the past 6 months? {yes no:314532}  Has the patient been a risk to others within the distant past? {yes no:314532}   Columbia Scale:  Flowsheet Row [facility] (Current) [facility] Most recent reading at 05/06/2024  5:00 PM ED from 05/06/2024 in [facility] Most recent reading at 05/06/2024  2:21 PM ED from 02/10/2024 in [facility] Emergency Department at [facility] Most recent reading at 02/10/2024 12:46 PM  C-SSRS RISK CATEGORY High Risk High Risk No Risk      Alcohol Screening: 1. How often do you have a  drink containing alcohol?: Never 2. How many drinks containing alcohol do you have on a typical day when you are drinking?: 1 or 2 3. How often do you have six or more drinks on one occasion?: Never AUDIT-C Score: 0 4. How often during the last year have you found that you were not able to stop drinking once you had started?: Never 5. How often during the last year have you failed to do what was normally expected from you because of drinking?: Never 6. How often during the last year have you needed a first drink in the morning to get yourself going after a heavy drinking session?: Never 7. How often during the last year have you had a feeling of guilt of remorse after drinking?: Never 8. How often during the last year have you been unable to remember what happened the night before because you had been drinking?: Never 9. Have you or someone else been injured as a result of your drinking?: No 10. Has a relative or friend or a doctor or another health worker been concerned about your drinking or suggested you cut down?: No Alcohol Use Disorder Identification Test Final Score (AUDIT): 0 Alcohol Brief Interventions/Follow-up: Patient Refused Substance Abuse History in the last 12 months:  {yes no:314532} Consequences of Substance Abuse: {[facility] CONSEQUENCES OF SUBSTANCE ABUSE:22880} Previous Psychotropic Medications: {YES/NO:21197} Psychological Evaluations: {YES/NO:21197} Past Medical History:  Past Medical History:  Diagnosis Date   ADHD    Leg fracture, left    Leg fracture, right    History reviewed. No pertinent surgical history. Family History: History reviewed. No pertinent family history. Family Psychiatric  History: ***  Tobacco Screening:  Social History   Tobacco Use  Smoking Status Every Day   Types: Cigarettes   Passive exposure: Current  Smokeless Tobacco Never    BH Tobacco Counseling     Are you interested in Tobacco Cessation Medications?  Yes, implement Nicotene  Replacement Protocol Counseled patient on smoking cessation:  No value filed. Reason Tobacco Screening Not Completed: No value filed.       Social History:  Social History   Substance and Sexual Activity  Alcohol Use Yes     Social History   Substance and Sexual Activity  Drug Use Yes   Types: Marijuana, Crack cocaine    Additional Social History:                           Allergies:   Allergies  Allergen Reactions   Fava Beans Anaphylaxis   Sulfa Antibiotics Anaphylaxis   Fish Allergy Other (See Comments)    Reaction unknown   Justicia Adhatoda (Malabar Nut Tree) [Justicia Adhatoda] Other (See Comments)    Reaction unknown    Lab Results:  Results for orders placed or performed during the hospital encounter of 05/06/24 (from the past 48 hours)  POCT Urine Drug Screen - (I-Screen)     Status: Abnormal   Collection Time: 05/06/24  1:32 PM  Result Value Ref Range   POC Amphetamine UR None Detected NONE DETECTED (Cut Off Level 1000 ng/mL)   POC Secobarbital (BAR) None Detected NONE DETECTED (Cut Off Level 300 ng/mL)   POC Buprenorphine (BUP) None Detected NONE DETECTED (Cut Off Level 10 ng/mL)   POC Oxazepam (BZO) None Detected NONE DETECTED (Cut Off Level 300 ng/mL)   POC Cocaine UR None Detected NONE DETECTED (Cut Off Level 300 ng/mL)   POC Methamphetamine UR None Detected NONE DETECTED (Cut Off Level 1000 ng/mL)   POC Morphine None Detected NONE DETECTED (Cut Off Level 300 ng/mL)   POC Methadone UR None Detected NONE DETECTED (Cut Off Level 300 ng/mL)   POC Oxycodone  UR None Detected NONE DETECTED (Cut Off Level 100 ng/mL)   POC Marijuana UR Positive (A) NONE DETECTED (Cut Off Level 50 ng/mL)  CBC with Differential/Platelet     Status: Abnormal   Collection Time: 05/06/24  1:33 PM  Result Value Ref Range   WBC 6.3 4.0 - 10.5 K/uL   RBC 4.16 (L) 4.22 - 5.81 MIL/uL   Hemoglobin 12.6 (L) 13.0 - 17.0 g/dL   HCT 61.6 (L) 60.9 - 47.9 %   MCV 92.1 80.0 -  100.0 fL   MCH 30.3 26.0 - 34.0 pg   MCHC 32.9 30.0 - 36.0 g/dL   RDW 87.0 88.4 - 84.4 %   Platelets 290 150 - 400 K/uL   nRBC 0.0 0.0 - 0.2 %   Neutrophils Relative % 48 %   Neutro Abs 3.0 1.7 - 7.7 K/uL   Lymphocytes Relative 42 %   Lymphs Abs 2.6 0.7 - 4.0 K/uL   Monocytes Relative 7 %   Monocytes Absolute 0.4 0.1 - 1.0 K/uL   Eosinophils Relative 2 %   Eosinophils Absolute 0.2 0.0 - 0.5 K/uL   Basophils Relative 1 %   Basophils Absolute 0.0 0.0 - 0.1 K/uL   Immature Granulocytes 0 %   Abs Immature Granulocytes 0.01 0.00 - 0.07 K/uL    Comment: Performed at [facility], [address] [place], [place] [address]  Comprehensive metabolic panel  Status: None   Collection Time: 05/06/24  1:33 PM  Result Value Ref Range   Sodium 139 135 - 145 mmol/L   Potassium 4.1 3.5 - 5.1 mmol/L   Chloride 100 98 - 111 mmol/L   CO2 30 22 - 32 mmol/L   Glucose, Bld 78 70 - 99 mg/dL    Comment: Glucose reference range applies only to samples taken after fasting for at least 8 hours.   BUN 9 6 - 20 mg/dL   Creatinine, Ser 9.36 0.61 - 1.24 mg/dL   Calcium 9.3 8.9 - 89.6 mg/dL   Total Protein 8.1 6.5 - 8.1 g/dL   Albumin 4.1 3.5 - 5.0 g/dL   AST 27 15 - 41 U/L   ALT 32 0 - 44 U/L   Alkaline Phosphatase 80 38 - 126 U/L   Total Bilirubin 1.1 0.0 - 1.2 mg/dL   GFR, Estimated >39 >39 mL/min    Comment: (NOTE) Calculated using the CKD-EPI Creatinine Equation (2021)    Anion gap 9 5 - 15    Comment: Performed at [facility], [address] [place], [place] [address]  Magnesium      Status: None   Collection Time: 05/06/24  1:33 PM  Result Value Ref Range   Magnesium  1.9 1.7 - 2.4 mg/dL    Comment: Performed at [facility], [address], [place], [place] [address]  Ethanol     Status: None   Collection Time: 05/06/24  1:33 PM  Result Value Ref Range   Alcohol, Ethyl (B) <15 <15 mg/dL    Comment: (NOTE) For medical purposes only. Performed at [facility]  Lab, [address], [place], [place] [address]   TSH     Status: None   Collection Time: 05/06/24  1:33 PM  Result Value Ref Range   TSH 1.757 0.350 - 4.500 uIU/mL    Comment: Performed by a 3rd Generation assay with a functional sensitivity of <=0.01 uIU/mL. Performed at [facility], [address], [place], [place] [address]     Blood Alcohol level:  Lab Results  Component Value Date   [facility] <15 05/06/2024   ETH <10 06/19/2022    Metabolic Disorder Labs:  No results found for: HGBA1C, MPG No results found for: PROLACTIN No results found for: CHOL, TRIG, HDL, CHOLHDL, VLDL, LDLCALC  Current Medications: Current Facility-Administered Medications  Medication Dose Route Frequency Provider Last Rate Last Admin   acetaminophen  (TYLENOL ) tablet 650 mg  650 mg Oral Q6H PRN Allen, Tina L, FNP       alum & mag hydroxide-simeth (MAALOX/MYLANTA) 200-200-20 MG/5ML suspension 30 mL  30 mL Oral Q4H PRN Allen, Tina L, FNP       haloperidol  (HALDOL ) tablet 5 mg  5 mg Oral TID PRN Dasie Ellouise CROME, FNP       And   diphenhydrAMINE  (BENADRYL ) capsule 50 mg  50 mg Oral TID PRN Dasie Ellouise CROME, FNP       haloperidol  lactate (HALDOL ) injection 5 mg  5 mg Intramuscular TID PRN Dasie Ellouise CROME, FNP       And   diphenhydrAMINE  (BENADRYL ) injection 50 mg  50 mg Intramuscular TID PRN Dasie Ellouise CROME, FNP       And   LORazepam  (ATIVAN ) injection 2 mg  2 mg Intramuscular TID PRN Dasie Ellouise CROME, FNP       haloperidol  lactate (HALDOL ) injection 10 mg  10 mg Intramuscular TID PRN Dasie Ellouise  L, FNP       And   diphenhydrAMINE  (BENADRYL ) injection 50 mg  50 mg Intramuscular TID PRN Dasie Ellouise CROME, FNP       And   LORazepam  (ATIVAN ) injection 2 mg  2 mg Intramuscular TID PRN Dasie Ellouise CROME, FNP       feeding supplement (ENSURE PLUS HIGH PROTEIN) liquid 237 mL  237 mL Oral BID BM Pashayan, Alexander S, DO       hydrOXYzine  (ATARAX ) tablet 25 mg  25 mg Oral TID PRN Allen, Tina L, FNP   25 mg at  05/06/24 2120   magnesium  hydroxide (MILK OF MAGNESIA) suspension 30 mL  30 mL Oral Daily PRN Dasie Ellouise CROME, FNP       nicotine  (NICODERM CQ  - dosed in mg/24 hours) patch 21 mg  21 mg Transdermal Daily Allen, Tina L, FNP       traZODone  (DESYREL ) tablet 50 mg  50 mg Oral QHS PRN Allen, Tina L, FNP   50 mg at 05/06/24 2120   PTA Medications: Medications Prior to Admission  Medication Sig Dispense Refill Last Dose/Taking   albuterol  (VENTOLIN  HFA) 108 (90 Base) MCG/ACT inhaler Inhale 2 puffs into the lungs every 6 (six) hours as needed for wheezing or shortness of breath. (Patient not taking: Reported on 05/06/2024) 18 g 0    nystatin  (MYCOSTATIN /NYSTOP ) powder Apply 1 Application topically 3 (three) times daily. (Patient not taking: Reported on 05/06/2024) 30 g 0     AIMS:  ,  ,  ,  ,  ,  ,    Musculoskeletal: Strength & Muscle Tone: within normal limits Gait & Station: normal Patient leans: N/A            Psychiatric Specialty Exam:  Presentation  General Appearance:  Appropriate for Environment; Casual  Eye Contact: Good  Speech: Clear and Coherent; Normal Rate  Speech Volume: Normal  Handedness: Right   Mood and Affect  Mood: Depressed  Affect: Depressed   Thought Process  Thought Processes: Coherent; Goal Directed; Linear  Duration of Psychotic Symptoms:{Duration of Depression Symptoms:28555} Past Diagnosis of Schizophrenia or Psychoactive disorder: No  Descriptions of Associations:Intact  Orientation:Full (Time, Place and Person)  Thought Content:Logical  Hallucinations:Hallucinations: None  Ideas of Reference:None  Suicidal Thoughts:Suicidal Thoughts: Yes, Active SI Active Intent and/or Plan: With Plan  Homicidal Thoughts:Homicidal Thoughts: Yes, Active HI Active Intent and/or Plan: With Plan; With Intent   Sensorium  Memory: Immediate Good; Recent Fair  Judgment: Impaired  Insight: Lacking   Executive Functions   Concentration: Fair  Attention Span: Fair  Recall: Fair  Fund of Knowledge: Good  Language: Good   Psychomotor Activity  Psychomotor Activity: Psychomotor Activity: Normal   Assets  Assets: Communication Skills; Desire for Improvement; Financial Resources/Insurance   Sleep  Sleep: Sleep: Fair  Estimated Sleeping Duration (Last 24 Hours): 7.50-8.50 hours   Physical Exam: Physical Exam Review of Systems  Neurological:  Positive for seizures (Documented history of Seizures).   Blood pressure 111/77, pulse 78, temperature 98 F (36.7 C), temperature source Oral, resp. rate 18, height 5' 7 (1.702 m), weight 75.6 kg, SpO2 100%. Body mass index is 26.09 kg/m.  Treatment Plan Summary: Daily contact with patient to assess and evaluate symptoms and progress in treatment and Medication management  Diagnoses / Active Problems:                  Assessment: 23 year old male      PLAN: Safety and Monitoring:             --  Voluntary admission to inpatient psychiatric unit for safety, stabilization and treatment             -- Daily contact with patient to assess and evaluate symptoms and progress in treatment             -- Patient's case to be discussed in multi-disciplinary team meeting             -- Observation Level: q15 minute checks             -- Vital signs:  q12 hours             -- Precautions: suicide, elopement, and assault   2. Psychiatric Diagnoses and Treatment:    # MDD  GAD  -- Continue  -- Hydroxyzine  25 mg oral, 3 times daily as needed, anxiety -- Trazodone  50 mg, oral, daily at bedtime as needed, sleep              -- Haldol  BH Agitation Protocol (See MAR)                   3. Medical Issues Being Addressed:           # Nicotine  Dependence  -- Nicotine  14 patch daily  -- Nicorette  Gum 2 mg as needed              -- Smoking cessation encouraged    # Asthma  -- Albuterol  inhaler 1 to 2 puffs every 4 hours as needed    4. Labs     -- CBC: RBC 4.16, Hemoglobin 12.6, HCT 38.3, otherwise WNL              -- CMP: Unremarkable              -- Ethanol: <15            -- TSH and Magnesium : Unremarkable              -- UDS: + THC              -- EKG: NSR  QT/QTc 354/371    -- The risks/benefits/side-effects/alternatives to this medication were discussed in detail with the patient and time was given for questions. The patient consents to medication trial.  -- FDA -- Metabolic profile and EKG monitoring obtained while on an atypical antipsychotic (BMI: Lipid Panel: HbgA1c: QTc:)               -- Encouraged patient to participate in unit milieu and in scheduled group therapies  -- Short Term Goals: Ability to identify changes in lifestyle to reduce recurrence of condition will improve, Ability to verbalize feelings will improve, Ability to disclose and discuss suicidal ideas, Ability to demonstrate self-control will improve, Ability to identify and develop effective coping behaviors will improve, Ability to maintain clinical measurements within normal limits will improve, Compliance with prescribed medications will improve, and Ability to identify triggers associated with substance abuse/mental health issues will improve             -- Long Term Goals: Improvement in symptoms so as ready for discharge     5. Discharge Planning:  -- Social work and case management to assist with discharge planning and identification of hospital follow-up needs prior to discharge -- Estimated LOS: 5-7 days -- Discharge Concerns: Need to establish a safety plan; Medication compliance and effectiveness -- Discharge Goals: Return home with outpatient referrals for mental health follow-up including medication management/psychotherapy  Physician Treatment Plan for Primary Diagnosis: MDD (major depressive disorder), single episode, severe (HCC) Long Term Goal(s): Improvement in symptoms so as ready for discharge  Short Term Goals: Ability to  identify changes in lifestyle to reduce recurrence of condition will improve, Ability to verbalize feelings will improve, Ability to disclose and discuss suicidal ideas, Ability to demonstrate self-control will improve, Ability to identify and develop effective coping behaviors will improve, Ability to maintain clinical measurements within normal limits will improve, Compliance with prescribed medications will improve, and Ability to identify triggers associated with substance abuse/mental health issues will improve  I certify that inpatient services furnished can reasonably be expected to improve the patient's condition.    Blair Chiquita Hint, NP 12/9/20256:44 AM

## 2024-05-07 NOTE — B[facility] Suicide Risk Assessment (Signed)
 [facility] INPATIENT:  Family/Significant Other Suicide Prevention Education  Suicide Prevention Education:  Contact Attempts: Deloris Granderson - [phone] 1st atempt 05/07/2024 @1241 ,VM was left (name of family member/significant other) has been identified by the patient as the family member/significant other with whom the patient will be residing, and identified as the person(s) who will aid the patient in the event of a mental health crisis.  With written consent from the patient, two attempts were made to provide suicide prevention education, prior to and/or following the patient's discharge.  We were unsuccessful in providing suicide prevention education.  A suicide education pamphlet was given to the patient to share with family/significant other.  Date and time of first attempt:05/07/2024 @1241     Golda Louder LCSWA 05/07/2024, 12:46 PM

## 2024-05-07 NOTE — Group Note (Signed)
 Date:  05/07/2024 Time:  8:55 PM  Group Topic/Focus:  Wrap-Up Group:   The focus of this group is to help patients review their daily goal of treatment and discuss progress on daily workbooks.    Participation Level:  Did Not Attend  Participation Quality:  none  Affect:  none  Cognitive:  none  Insight: none  Engagement in Group:  none  Modes of Intervention:  Discussion  Additional Comments:   Pt did attend wrap up group  Tarnesha Ulloa A Sherley Mckenney 05/07/2024, 8:55 PM

## 2024-05-07 NOTE — Group Note (Addendum)
 LCSW Group Therapy Note   Group Date: 05/07/2024 Start Time: 1100 End Time: 1200   Participation:  did not attend  Type of Therapy:  Group Therapy  Topic: Finding Balance: Using Wise Mind for Thoughtful Decisions  Objective:  To help participants understand and apply the concept of Delsie Mind to make balanced, thoughtful decisions by integrating emotion and logic.  Goals: Learn the differences between Emotional Mind, Reasonable Mind, and [employer]. Recognize personal signs of Emotional and Reasonable Mind. Practice using [employer] in real-life scenarios.  Therapeutic Modalities: - Elements of Dialectical Behavior Therapy (DBT):  Mindfulness (noticing thoughts and emotions without judgment), Emotion Regulation (understanding and managing emotional responses), Distress Tolerance (coping with difficult situations without making them worse), Wise Mind (integrating emotion and reason for balanced decision-making) - Elements of Cognitive Behavioral Therapy (CBT):  Identifying automatic thoughts, Challenging cognitive distortions, Using logic to reframe unhelpful thinking patterns  Summary:  This class focused on [employer], DBT's concept of balancing Emotional Mind and Reasonable Mind. We identified when we're in each state and practiced using Wise Mind to respond thoughtfully in real-life situations. By combining emotion and logic, participants can improve decision-making, manage challenges, and enhance relationships.   Citlalic Norlander O Braylie Badami, LCSWA 05/07/2024  12:48 PM

## 2024-05-08 ENCOUNTER — Encounter (HOSPITAL_COMMUNITY): Payer: Self-pay

## 2024-05-08 DIAGNOSIS — Z87898 Personal history of other specified conditions: Secondary | ICD-10-CM

## 2024-05-08 DIAGNOSIS — F1220 Cannabis dependence, uncomplicated: Secondary | ICD-10-CM | POA: Insufficient documentation

## 2024-05-08 LAB — LIPID PANEL
Cholesterol: 139 mg/dL (ref 0–200)
HDL: 53 mg/dL (ref >40)
LDL Cholesterol: 75 mg/dL (ref 0–99)
Total CHOL/HDL Ratio: 2.6 ratio
Triglycerides: 53 mg/dL (ref <150)
VLDL: 11 mg/dL (ref 0–40)

## 2024-05-08 LAB — HEMOGLOBIN A1C
Hgb A1c MFr Bld: 4.2 % — ABNORMAL LOW (ref 4.8–5.6)
Mean Plasma Glucose: 73.84 mg/dL

## 2024-05-08 LAB — SYPHILIS: RPR W/REFLEX TO RPR TITER AND TREPONEMAL ANTIBODIES, TRADITIONAL SCREENING AND DIAGNOSIS ALGORITHM: RPR Ser Ql: NONREACTIVE

## 2024-05-08 LAB — VITAMIN D 25 HYDROXY (VIT D DEFICIENCY, FRACTURES): Vit D, 25-Hydroxy: 26.34 ng/mL — ABNORMAL LOW (ref 30–100)

## 2024-05-08 NOTE — Group Note (Signed)
 Recreation Therapy Group Note   Group Topic:Leisure Education  Group Date: 05/08/2024 Start Time: 0930 End Time: 1020 Facilitators: Laurali Goddard-McCall, LRT,CTRS Location: [address] Dayroom   Group Topic: Leisure Education   Goal Area(s) Addresses:  Patient will identify positive leisure activities for use post discharge. Patient will identify at least one positive benefit of participation in leisure activities.    Behavioral Response:     Intervention: Innovation, Individual Creativity, Construction Paper, Markers    Activity: Patients were asked to create their own community program for the community. Patients were to come up with a name for their program, audience program is geared towards, where program will be conducted, hours of operation and what are the benefits of the program.   Education:  Leisure Scientist, Physiological, Communication, Teamwork, Discharge Planning   Education Outcome: Acknowledges education/In group clarification offered/Needs additional education.    Affect/Mood: N/A   Participation Level: Did not attend    Clinical Observations/Individualized Feedback:     Plan: Continue to engage patient in RT group sessions 2-3x/week.   Raylinn Kosar-McCall, LRT,CTRS 05/08/2024 1:31 PM

## 2024-05-08 NOTE — Group Note (Signed)
 Date:  05/08/2024 Time:  9:49 AM  Group Topic/Focus:  Goals Group:   The focus of this group is to help patients establish daily goals to achieve during treatment and discuss how the patient can incorporate goal setting into their daily lives to aide in recovery.    Participation Level:  Did Not Attend  Participation Quality:  Did Not Attend  Affect:  Did Not Attend  Cognitive:  Did Not Attend  Insight: None  Engagement in Group:  Did Not Attend  Modes of Intervention:  Did Not Attend  Additional Comments:  Did Not Attend  Robert Silva 05/08/2024, 9:49 AM

## 2024-05-08 NOTE — Group Note (Signed)
 Date:  05/08/2024 Time:  8:45 PM  Group Topic/Focus: Gut flora Self Care:   The focus of this group is to help patients understand the importance of self-care in order to improve or restore emotional, physical, spiritual, interpersonal, and financial health.    Participation Level:  Did Not Attend   Robert Silva 05/08/2024, 8:45 PM

## 2024-05-08 NOTE — BH IP Treatment Plan (Signed)
 Interdisciplinary Treatment and Diagnostic Plan Update  05/08/2024 Time of Session: 1000AM Tel [place] MRN: [id number]  Principal Diagnosis: MDD (major depressive disorder), single episode, severe (HCC)  Secondary Diagnoses: Principal Problem:   MDD (major depressive disorder), single episode, severe (HCC) Active Problems:   Asthma, mild intermittent   History of seizure   Moderate tetrahydrocannabinol (THC) dependence (HCC)   Current Medications:  Current Facility-Administered Medications  Medication Dose Route Frequency Provider Last Rate Last Admin   acetaminophen  (TYLENOL ) tablet 650 mg  650 mg Oral Q6H PRN Dasie Ellouise CROME, FNP       albuterol  (VENTOLIN  HFA) 108 (90 Base) MCG/ACT inhaler 1-2 puff  1-2 puff Inhalation Q4H PRN Bennett, Christal H, NP       alum & mag hydroxide-simeth (MAALOX/MYLANTA) 200-200-20 MG/5ML suspension 30 mL  30 mL Oral Q4H PRN Dasie Ellouise CROME, FNP       haloperidol  (HALDOL ) tablet 5 mg  5 mg Oral TID PRN Dasie Ellouise CROME, FNP   5 mg at 05/07/24 0844   And   diphenhydrAMINE  (BENADRYL ) capsule 50 mg  50 mg Oral TID PRN Allen, Tina L, FNP   50 mg at 05/07/24 0844   haloperidol  lactate (HALDOL ) injection 5 mg  5 mg Intramuscular TID PRN Dasie Ellouise CROME, FNP       And   diphenhydrAMINE  (BENADRYL ) injection 50 mg  50 mg Intramuscular TID PRN Dasie Ellouise CROME, FNP       And   LORazepam  (ATIVAN ) injection 2 mg  2 mg Intramuscular TID PRN Dasie Ellouise CROME, FNP       haloperidol  lactate (HALDOL ) injection 10 mg  10 mg Intramuscular TID PRN Dasie Ellouise CROME, FNP       And   diphenhydrAMINE  (BENADRYL ) injection 50 mg  50 mg Intramuscular TID PRN Dasie Ellouise CROME, FNP       And   LORazepam  (ATIVAN ) injection 2 mg  2 mg Intramuscular TID PRN Dasie Ellouise CROME, FNP       feeding supplement (ENSURE PLUS HIGH PROTEIN) liquid 237 mL  237 mL Oral BID BM Pashayan, Alexander S, DO   237 mL at 05/08/24 1025   hydrOXYzine  (ATARAX ) tablet 25 mg  25 mg Oral TID PRN Dasie Ellouise CROME, FNP   25 mg at  05/07/24 2134   levETIRAcetam  (KEPPRA  XR) 24 hr tablet 500 mg  500 mg Oral Daily Bennett, Christal H, NP   500 mg at 05/08/24 1025   magnesium  hydroxide (MILK OF MAGNESIA) suspension 30 mL  30 mL Oral Daily PRN Dasie Ellouise CROME, FNP       nicotine  (NICODERM CQ  - dosed in mg/24 hours) patch 14 mg  14 mg Transdermal Daily Bennett, Christal H, NP   14 mg at 05/07/24 0844   nicotine  polacrilex (NICORETTE ) gum 2 mg  2 mg Oral PRN Bennett, Christal H, NP       sertraline  (ZOLOFT ) tablet 50 mg  50 mg Oral Daily Bennett, Christal H, NP   50 mg at 05/08/24 1025   traZODone  (DESYREL ) tablet 50 mg  50 mg Oral QHS PRN Allen, Tina L, FNP   50 mg at 05/07/24 2134   PTA Medications: Medications Prior to Admission  Medication Sig Dispense Refill Last Dose/Taking   albuterol  (VENTOLIN  HFA) 108 (90 Base) MCG/ACT inhaler Inhale 2 puffs into the lungs every 6 (six) hours as needed for wheezing or shortness of breath. (Patient not taking: Reported on 05/06/2024) 18 g 0    nystatin  (MYCOSTATIN /NYSTOP )  powder Apply 1 Application topically 3 (three) times daily. (Patient not taking: Reported on 05/06/2024) 30 g 0     Patient Stressors: Other: people    Patient Strengths: Other: don't have none  Treatment Modalities: Medication Management, Group therapy, Case management,  1 to 1 session with clinician, Psychoeducation, Recreational therapy.   Physician Treatment Plan for Primary Diagnosis: MDD (major depressive disorder), single episode, severe (HCC) Long Term Goal(s): Improvement in symptoms so as ready for discharge   Short Term Goals: Ability to identify changes in lifestyle to reduce recurrence of condition will improve Ability to verbalize feelings will improve Ability to disclose and discuss suicidal ideas Ability to demonstrate self-control will improve Ability to identify and develop effective coping behaviors will improve Ability to maintain clinical measurements within normal limits will  improve Compliance with prescribed medications will improve Ability to identify triggers associated with substance abuse/mental health issues will improve  Medication Management: Evaluate patient's response, side effects, and tolerance of medication regimen.  Therapeutic Interventions: 1 to 1 sessions, Unit Group sessions and Medication administration.  Evaluation of Outcomes: Not Progressing  Physician Treatment Plan for Secondary Diagnosis: Principal Problem:   MDD (major depressive disorder), single episode, severe (HCC) Active Problems:   Asthma, mild intermittent   History of seizure   Moderate tetrahydrocannabinol (THC) dependence (HCC)  Long Term Goal(s): Improvement in symptoms so as ready for discharge   Short Term Goals: Ability to identify changes in lifestyle to reduce recurrence of condition will improve Ability to verbalize feelings will improve Ability to disclose and discuss suicidal ideas Ability to demonstrate self-control will improve Ability to identify and develop effective coping behaviors will improve Ability to maintain clinical measurements within normal limits will improve Compliance with prescribed medications will improve Ability to identify triggers associated with substance abuse/mental health issues will improve     Medication Management: Evaluate patient's response, side effects, and tolerance of medication regimen.  Therapeutic Interventions: 1 to 1 sessions, Unit Group sessions and Medication administration.  Evaluation of Outcomes: Not Progressing   RN Treatment Plan for Primary Diagnosis: MDD (major depressive disorder), single episode, severe (HCC) Long Term Goal(s): Knowledge of disease and therapeutic regimen to maintain health will improve  Short Term Goals: Ability to remain free from injury will improve, Ability to verbalize frustration and anger appropriately will improve, Ability to demonstrate self-control, Ability to participate in  decision making will improve, Ability to verbalize feelings will improve, Ability to disclose and discuss suicidal ideas, Ability to identify and develop effective coping behaviors will improve, and Compliance with prescribed medications will improve  Medication Management: RN will administer medications as ordered by provider, will assess and evaluate patient's response and provide education to patient for prescribed medication. RN will report any adverse and/or side effects to prescribing provider.  Therapeutic Interventions: 1 on 1 counseling sessions, Psychoeducation, Medication administration, Evaluate responses to treatment, Monitor vital signs and CBGs as ordered, Perform/monitor CIWA, COWS, AIMS and Fall Risk screenings as ordered, Perform wound care treatments as ordered.  Evaluation of Outcomes: Not Progressing   LCSW Treatment Plan for Primary Diagnosis: MDD (major depressive disorder), single episode, severe (HCC) Long Term Goal(s): Safe transition to appropriate next level of care at discharge, Engage patient in therapeutic group addressing interpersonal concerns.  Short Term Goals: Engage patient in aftercare planning with referrals and resources, Increase social support, Increase ability to appropriately verbalize feelings, Increase emotional regulation, Facilitate acceptance of mental health diagnosis and concerns, Facilitate patient progression through stages of change  regarding substance use diagnoses and concerns, Identify triggers associated with mental health/substance abuse issues, and Increase skills for wellness and recovery  Therapeutic Interventions: Assess for all discharge needs, 1 to 1 time with Social worker, Explore available resources and support systems, Assess for adequacy in community support network, Educate family and significant other(s) on suicide prevention, Complete Psychosocial Assessment, Interpersonal group therapy.  Evaluation of Outcomes: Not  Progressing   Progress in Treatment: Attending groups: No. Participating in groups: No. Taking medication as prescribed: Yes. Toleration medication: Yes. Family/Significant other contact made: No, will contact:  Deloris Mciver [phone] Patient understands diagnosis: Yes. Discussing patient identified problems/goals with staff: Yes. Medical problems stabilized or resolved: Yes. Denies suicidal/homicidal ideation: Yes. Issues/concerns per patient self-inventory: No.  New problem(s) identified: No, Describe:  none  New Short Term/Long Term Goal(s): medication stabilization, elimination of SI thoughts, development of comprehensive mental wellness plan.    Patient Goals:  Better anger management control and better depression control  Discharge Plan or Barriers: Patient recently admitted. CSW will continue to follow and assess for appropriate referrals and possible discharge planning.    Reason for Continuation of Hospitalization: Depression Medication stabilization Suicidal ideation  Estimated Length of Stay: 5-7 days  Last 3 [place] Suicide Severity Risk Score: Flowsheet Row [facility] (Current) [facility] Most recent reading at 05/06/2024  5:00 PM ED from 05/06/2024 in [facility] Most recent reading at 05/06/2024  2:21 PM ED from 02/10/2024 in [facility] Emergency Department at [facility] Most recent reading at 02/10/2024 12:46 PM  C-SSRS RISK CATEGORY High Risk High Risk No Risk    Last PHQ 2/9 Scores:    07/13/2023   11:17 AM  Depression screen PHQ 2/9  Decreased Interest 2  Down, Depressed, Hopeless 3  PHQ - 2 Score 5  Altered sleeping 2  Tired, decreased energy 2  Change in appetite 2  Feeling bad or failure about yourself  3  Trouble concentrating 3  Moving slowly or fidgety/restless 1  Suicidal thoughts 3  PHQ-9 Score 21   Difficult doing work/chores Somewhat  difficult     Data saved with a previous flowsheet row definition    Scribe for Treatment Team: Jenkins LULLA Primer, LCSWA 05/08/2024 11:56 AM

## 2024-05-08 NOTE — Progress Notes (Signed)
(  Sleep Hours) -12.5  (Any PRNs that were needed, meds refused, or side effects to meds)- hydroxyzine  25mg ; Trazodone  50mg   (Any disturbances and when (visitation, over night)-very loud/boisterous at beginning of shift. Not aggressive toward patients or staff. RN reminded patient three times to calm voice and patient eventually did without needing further reminders before bedtime.  (Concerns raised by the patient)- none  (SI/HI/AVH)-denies

## 2024-05-08 NOTE — Group Note (Signed)
 Date:  05/08/2024 Time:  8:11 PM  Group Topic/Focus:  Wrap-Up Group:   The focus of this group is to help patients review their daily goal of treatment and discuss progress on daily workbooks.    Participation Level:  Active  Participation Quality:  Resistant  Affect:  Anxious and Resistant  Cognitive:  Disorganized  Insight: Lacking  Engagement in Group:  Distracting and Resistant  Modes of Intervention:  Discussion  Additional Comments:   Pt attended NA meeting  Lemoine Goyne A Talyia Allende 05/08/2024, 8:11 PM

## 2024-05-08 NOTE — [facility] Group Notes (Signed)
 Patient did not attend the Recreation Therapy group.

## 2024-05-08 NOTE — Progress Notes (Signed)
 Spiritual care group facilitated by Chaplain Rockie Sofia, [facility]  Group focused on topic of strength. Group members reflected on what thoughts and feelings emerge when they hear this topic. They then engaged in facilitated dialog around how strength is present in their lives. This dialog focused on representing what strength had been to them in their lives (images and patterns given) and what they saw as helpful in their life now (what they needed / wanted).  Activity drew on narrative framework.  Patient Progress: Robert Silva attended the last part of group and actively engaged and participated in group conversation during that time.

## 2024-05-08 NOTE — Group Note (Signed)
 Date:  05/08/2024 Time:  2:10 PM  Group Topic/Focus:  Spirituality:   The focus of this group is to discuss how one's spirituality can aide in recovery.    Participation Level:  Did Not Attend  Logan LITTIE Molly 05/08/2024, 2:10 PM

## 2024-05-08 NOTE — Plan of Care (Signed)
  Problem: Education: Goal: Emotional status will improve Outcome: Progressing   Problem: Education: Goal: Verbalization of understanding the information provided will improve Outcome: Progressing

## 2024-05-08 NOTE — [facility] Group Notes (Signed)
Patient did not attend the Pharmacy group.

## 2024-05-08 NOTE — Progress Notes (Signed)
°   05/08/24 1025  Charting Type  Charting Type Shift assessment  Focused Reassessment No Changes Psychosocial  Focused Reassessment Changes Noted Psychosocial  Safety Check Verification  Has the RN verified the 15 minute safety check completion? Yes  Neurological  Neuro (WDL) WDL  HEENT  HEENT (WDL) WDL  Respiratory  Respiratory (WDL) WDL  Cardiac  Cardiac (WDL) WDL  Vascular  Vascular (WDL) WDL  Integumentary  Integumentary (WDL) WDL  Braden Scale (Ages 8 and up)  Sensory Perceptions 4  Moisture 4  Activity 4  Mobility 4  Nutrition 3  Friction and Shear 3  Braden Scale Score 22  Musculoskeletal  Musculoskeletal (WDL) WDL  Assistive Device None  Gastrointestinal  Gastrointestinal (WDL) WDL  GU Assessment  Genitourinary (WDL) WDL  Genitalia  Male Genitalia Intact  Neurological  Level of Consciousness Alert

## 2024-05-08 NOTE — Plan of Care (Signed)
   Problem: Education: Goal: Knowledge of [place] [facility] Education information/materials will improve Outcome: Progressing Goal: Emotional status will improve Outcome: Progressing Goal: Mental status will improve Outcome: Progressing
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
## 2024-05-09 NOTE — Progress Notes (Signed)
 [facility] MD Progress Note  05/09/2024 3:59 PM Robert Silva  MRN:  [id number]  Reason for admission: 23 year old male with a history of adjustment disorder and ADHD who was admitted to the [facility] following evaluation at [facility]. He presented voluntarily, accompanied by his Hotel Manager, reporting active suicidal ideation with a plan as well as homicidal ideation with a plan in the context of significant psychosocial stressors, primarily homelessness. He reports previous suicide attempts. He denies any prior psychiatric hospitalizations. His medical history is notable for intermittent asthma and a seizure disorder.   Daily notes: Robert Silva) is seen outside his room today. Chart reviewed. The chart findings discussed with the treatment team. He presents highly euphoric. He is more agile today than yesterday when he was assessed. He has been up & about within the unit. He did attend group session today. He is making a good eye contact. He reports, I'm doing very well. Actually, I'm doing better. I feel ready to be discharged. I will be going back to the [facility]. I have been leaving there for 12 months. They are working with me to help place me in an apartment. I think I will need therapy after discharge. I will need someone to talk from time about things that tend to bother me sometimes. Other than this, I'm no longer depressed or anxious. I have been sleeping well since being in here. I'm doing well on my medicines as well. There are no side effects felt. [name] currently denies any SIHI, AVH, delusional thoughts or paranoia. He does not appear to be responding to any internal stimuli. Discussed this patient at the team meeting this afternoon. It was agreed upon that [name] will be discharged in the morning. There are no changes made on the current plan of care. Continue as already in progress.  Principal Problem: MDD (major  depressive disorder), single episode, severe (HCC)  Diagnosis: Principal Problem:   MDD (major depressive disorder), single episode, severe (HCC) Active Problems:   Asthma, mild intermittent   History of seizure   Moderate tetrahydrocannabinol (THC) dependence (HCC)  Total Time spent with patient: 45 minutes  Past Psychiatric History: See H&P.  Past Medical History:  Past Medical History:  Diagnosis Date   ADHD    Leg fracture, left    Leg fracture, right    History reviewed. No pertinent surgical history.  Family History: History reviewed. No pertinent family history.  Family Psychiatric  History: See H&P.  Social History:  Social History   Substance and Sexual Activity  Alcohol Use Yes     Social History   Substance and Sexual Activity  Drug Use Yes   Types: Marijuana, Crack cocaine    Social History   Socioeconomic History   Marital status: Single    Spouse name: Not on file   Number of children: Not on file   Years of education: Not on file   Highest education level: 11th grade  Occupational History   Not on file  Tobacco Use   Smoking status: Every Day    Types: Cigarettes    Passive exposure: Current   Smokeless tobacco: Never  Substance and Sexual Activity   Alcohol use: Yes   Drug use: Yes    Types: Marijuana, Crack cocaine   Sexual activity: Yes  Other Topics Concern   Not on file  Social History Narrative   ** Merged History Encounter **       Social  Drivers of Health   Tobacco Use: High Risk (05/06/2024)   Patient History    Smoking Tobacco Use: Every Day    Smokeless Tobacco Use: Never    Passive Exposure: Current  Financial Resource Strain: High Risk (07/13/2023)   Overall Financial Resource Strain (CARDIA)    Difficulty of Paying Living Expenses: Hard  Food Insecurity: Food Insecurity Present (05/06/2024)   Epic    Worried About Programme Researcher, Broadcasting/film/video in the Last Year: Often true    Ran Out of Food in the Last Year: Often true   Transportation Needs: Unmet Transportation Needs (05/06/2024)   Epic    Lack of Transportation (Medical): Yes    Lack of Transportation (Non-Medical): Yes  Physical Activity: Insufficiently Active (07/13/2023)   Exercise Vital Sign    Days of Exercise per Week: 3 days    Minutes of Exercise per Session: 30 min  Stress: No Stress Concern Present (07/13/2023)   [employer] of Occupational Health - Occupational Stress Questionnaire    Feeling of Stress : Only a little  Social Connections: Moderately Integrated (02/01/2024)   Social Connection and Isolation Panel    Frequency of Communication with Friends and Family: Twice a week    Frequency of Social Gatherings with Friends and Family: Once a week    Attends Religious Services: 1 to 4 times per year    Active Member of Clubs or Organizations: No    Attends Banker Meetings: 1 to 4 times per year    Marital Status: Never married  Depression (PHQ2-9): High Risk (07/13/2023)   Depression (PHQ2-9)    PHQ-2 Score: 21  Alcohol Screen: Low Risk (05/06/2024)   Alcohol Screen    Last Alcohol Screening Score (AUDIT): 0  Housing: High Risk (05/06/2024)   Epic    Unable to Pay for Housing in the Last Year: Yes    Number of Times Moved in the Last Year: 6    Homeless in the Last Year: Yes  Utilities: Not At Risk (05/06/2024)   Epic    Threatened with loss of utilities: No  Health Literacy: Adequate Health Literacy (01/25/2024)   B1300 Health Literacy    Frequency of need for help with medical instructions: Rarely   Additional Social History:    Sleep: Good Estimated Sleeping Duration (Last 24 Hours): 10.00-11.25 hours  Appetite:  Good  Current Medications: Current Facility-Administered Medications  Medication Dose Route Frequency Provider Last Rate Last Admin   acetaminophen  (TYLENOL ) tablet 650 mg  650 mg Oral Q6H PRN Dasie Ellouise CROME, FNP       albuterol  (VENTOLIN  HFA) 108 (90 Base) MCG/ACT inhaler 1-2 puff  1-2 puff  Inhalation Q4H PRN Bennett, Christal H, NP       alum & mag hydroxide-simeth (MAALOX/MYLANTA) 200-200-20 MG/5ML suspension 30 mL  30 mL Oral Q4H PRN Dasie Ellouise CROME, FNP       haloperidol  (HALDOL ) tablet 5 mg  5 mg Oral TID PRN Dasie Ellouise CROME, FNP   5 mg at 05/07/24 0844   And   diphenhydrAMINE  (BENADRYL ) capsule 50 mg  50 mg Oral TID PRN Dasie Ellouise CROME, FNP   50 mg at 05/07/24 0844   haloperidol  lactate (HALDOL ) injection 5 mg  5 mg Intramuscular TID PRN Dasie Ellouise CROME, FNP       And   diphenhydrAMINE  (BENADRYL ) injection 50 mg  50 mg Intramuscular TID PRN Dasie Ellouise CROME, FNP       And   LORazepam  (ATIVAN )  injection 2 mg  2 mg Intramuscular TID PRN Dasie Ellouise CROME, FNP       haloperidol  lactate (HALDOL ) injection 10 mg  10 mg Intramuscular TID PRN Dasie Ellouise CROME, FNP       And   diphenhydrAMINE  (BENADRYL ) injection 50 mg  50 mg Intramuscular TID PRN Dasie Ellouise CROME, FNP       And   LORazepam  (ATIVAN ) injection 2 mg  2 mg Intramuscular TID PRN Dasie Ellouise CROME, FNP       feeding supplement (ENSURE PLUS HIGH PROTEIN) liquid 237 mL  237 mL Oral BID BM Pashayan, Alexander GORMAN, DO   237 mL at 05/09/24 1304   hydrOXYzine  (ATARAX ) tablet 25 mg  25 mg Oral TID PRN Allen, Tina L, FNP   25 mg at 05/08/24 2119   levETIRAcetam  (KEPPRA  XR) 24 hr tablet 500 mg  500 mg Oral Daily Bennett, Christal H, NP   500 mg at 05/09/24 0807   magnesium  hydroxide (MILK OF MAGNESIA) suspension 30 mL  30 mL Oral Daily PRN Dasie Ellouise CROME, FNP       nicotine  (NICODERM CQ  - dosed in mg/24 hours) patch 14 mg  14 mg Transdermal Daily Bennett, Christal H, NP   14 mg at 05/09/24 9192   nicotine  polacrilex (NICORETTE ) gum 2 mg  2 mg Oral PRN Bennett, Christal H, NP       sertraline  (ZOLOFT ) tablet 50 mg  50 mg Oral Daily Bennett, Christal H, NP   50 mg at 05/09/24 0807   traZODone  (DESYREL ) tablet 50 mg  50 mg Oral QHS PRN Dasie Ellouise CROME, FNP   50 mg at 05/08/24 2118   Lab Results:  Results for orders placed or performed during the hospital  encounter of 05/06/24 (from the past 48 hours)  HIV Antibody (routine testing w rflx)     Status: None   Collection Time: 05/07/24  6:36 PM  Result Value Ref Range   HIV Screen 4th Generation wRfx Non Reactive Non Reactive    Comment: Performed at [facility], [address] [place], [place] [address]  VITAMIN D 25 Hydroxy (Vit-D Deficiency, Fractures)     Status: Abnormal   Collection Time: 05/08/24  6:15 AM  Result Value Ref Range   Vit D, 25-Hydroxy 26.34 (L) 30 - 100 ng/mL    Comment: (NOTE) Vitamin D deficiency has been defined by the Institute of Medicine  and an Endocrine Society practice guideline as a level of serum 25-OH  vitamin D less than 20 ng/mL (1,2). The Endocrine Society went on to  further define vitamin D insufficiency as a level between 21 and 29  ng/mL (2).  1. IOM (Institute of Medicine). 2010. Dietary reference intakes for  calcium and D. [place]: [employer]. 2. Holick MF, Binkley [place], Bischoff-Ferrari HA, et al. Evaluation,  treatment, and prevention of vitamin D deficiency: an Endocrine  Society clinical practice guideline, JCEM. 2011 Jul; 96(7): 1911-30.  Performed at [facility], [address] [place], [place] [address]   Hemoglobin A1c     Status: Abnormal   Collection Time: 05/08/24  6:15 AM  Result Value Ref Range   Hgb A1c MFr Bld 4.2 (L) 4.8 - 5.6 %    Comment: (NOTE) Diagnosis of Diabetes The following HbA1c ranges recommended by the American Diabetes Association (ADA) may be used as an aid in the diagnosis of diabetes mellitus.  Hemoglobin  Suggested A1C NGSP%              Diagnosis  <5.7                   Non Diabetic  5.7-6.4                Pre-Diabetic  >6.4                   Diabetic  <7.0                   Glycemic control for                       adults with diabetes.     Mean Plasma Glucose 73.84 mg/dL    Comment: Performed at [facility], [address],  [place], [place] [address]  Lipid panel     Status: None   Collection Time: 05/08/24  6:15 AM  Result Value Ref Range   Cholesterol 139 0 - 200 mg/dL    Comment:        ATP III CLASSIFICATION:  <200     mg/dL   Desirable  799-760  mg/dL   Borderline High  >=759    mg/dL   High           Triglycerides 53 <150 mg/dL   HDL 53 >59 mg/dL   Total CHOL/HDL Ratio 2.6 RATIO   VLDL 11 0 - 40 mg/dL   LDL Cholesterol 75 0 - 99 mg/dL    Comment:        Total Cholesterol/HDL:CHD Risk Coronary Heart Disease Risk Table                     Men   Women  1/2 Average Risk   3.4   3.3  Average Risk       5.0   4.4  2 X Average Risk   9.6   7.1  3 X Average Risk  23.4   11.0        Use the calculated Patient Ratio above and the CHD Risk Table to determine the patient's CHD Risk.        ATP III CLASSIFICATION (LDL):  <100     mg/dL   Optimal  899-870  mg/dL   Near or Above                    Optimal  130-159  mg/dL   Borderline  839-810  mg/dL   High  >809     mg/dL   Very High Performed at [facility], [address], [place], [place] [address]   RPR     Status: None   Collection Time: 05/08/24  6:15 AM  Result Value Ref Range   RPR Ser Ql NON REACTIVE NON REACTIVE    Comment: Performed at [facility], [address], [place], [place] [address]    Blood Alcohol level:  Lab Results  Component Value Date   [facility] <15 05/06/2024   ETH <10 06/19/2022    Metabolic Disorder Labs: Lab Results  Component Value Date   HGBA1C 4.2 (L) 05/08/2024   MPG 73.84 05/08/2024   No results found for: PROLACTIN Lab Results  Component Value Date   CHOL 139 05/08/2024   TRIG 53 05/08/2024   HDL 53 05/08/2024   CHOLHDL 2.6 05/08/2024   VLDL 11 05/08/2024  LDLCALC 75 05/08/2024    Physical Findings: AIMS:  ,  ,  ,  ,  ,  ,   CIWA:  CIWA-Ar Total: 1 COWS:  COWS Total Score: 0  Musculoskeletal: Strength & Muscle Tone: within normal limits Gait & Station:  normal Patient leans: N/A  Psychiatric Specialty Exam:  Presentation  General Appearance:  Casual; Fairly Groomed  Eye Contact: Good  Speech: Clear and Coherent (talkative.)  Speech Volume: Increased  Handedness: Right   Mood and Affect  Mood: Euphoric  Affect: Congruent   Thought Process  Thought Processes: Coherent; Goal Directed; Linear  Descriptions of Associations:Intact  Orientation:Full (Time, Place and Person)  Thought Content:Logical  History of Schizophrenia/Schizoaffective disorder:No  Duration of Psychotic Symptoms: NA Hallucinations:Hallucinations: None   Ideas of Reference:None  Suicidal Thoughts:Suicidal Thoughts: No   Homicidal Thoughts:Homicidal Thoughts: No    Sensorium  Memory: Immediate Good; Recent Good; Remote Good  Judgment: Fair  Insight: Fair   Art Therapist  Concentration: Fair  Attention Span: Fair  Recall: Good  Fund of Knowledge: Fair  Language: Good   Psychomotor Activity  Psychomotor Activity: Psychomotor Activity: Increased    Assets  Assets: Communication Skills; Desire for Improvement; Physical Health; Resilience; Social Support  Sleep  Sleep: Sleep: Good Number of Hours of Sleep: 8   Physical Exam: Physical Exam Vitals and nursing note reviewed.  HENT:     Head: Normocephalic.     Nose: Nose normal.  Cardiovascular:     Rate and Rhythm: Normal rate.     Pulses: Normal pulses.  Pulmonary:     Effort: Pulmonary effort is normal.  Genitourinary:    Comments: Deferred. Musculoskeletal:        General: Normal range of motion.     Cervical back: Normal range of motion.  Skin:    General: Skin is dry.  Neurological:     General: No focal deficit present.     Mental Status: He is alert and oriented to person, place, and time.    Review of Systems  Constitutional:  Negative for chills, diaphoresis and fever.  HENT:  Negative for congestion and sore throat.    Respiratory:  Negative for cough, shortness of breath and wheezing.   Cardiovascular:  Negative for chest pain and palpitations.  Gastrointestinal:  Negative for abdominal pain, constipation, diarrhea, heartburn, nausea and vomiting.  Musculoskeletal:  Negative for joint pain and myalgias.  Neurological:  Negative for dizziness, tingling, tremors, sensory change, speech change, focal weakness, seizures, loss of consciousness, weakness and headaches.  Endo/Heme/Allergies:        See the allergy lists.  Psychiatric/Behavioral:  Positive for depression (Improving.) and substance abuse (UDS (+) for THC.). Negative for hallucinations, memory loss and suicidal ideas. The patient is not nervous/anxious and does not have insomnia.    Blood pressure 118/83, pulse 100, temperature 98.2 F (36.8 C), temperature source Oral, resp. rate 16, height 5' 7 (1.702 m), weight 75.6 kg, SpO2 100%. Body mass index is 26.09 kg/m.  Treatment Plan Summary: Daily contact with patient to assess and evaluate symptoms and progress in treatment and Medication management.  Principal/active diagnoses.  Plan: The risks/benefits/side-effects/alternatives to the medications in use were discussed in detail with the patient and time was given for patient's questions. The patient consents to medication trial.   # MDD  GAD  -- Continue Zoloft  50 mg oral daily for depression and anxiety -- Consider initiation of Depakote to address impulse control if clinically indicated. -- Hydroxyzine  25 mg  oral, 3 times daily as needed, anxiety -- Trazodone  50 mg, daily at bedtime as needed, sleep              -- Haldol  BH Agitation Protocol (See MAR)                 1. Medical Issues Being Addressed:           # Nicotine  Dependence  -- Nicotine  14 patch daily  -- Nicorette  Gum 2 mg as needed   Other PRNS -Continue Tylenol  650 mg every 6 hours PRN for mild pain -Continue Maalox 30 ml Q 4 hrs PRN for indigestion -Continue MOM 30  ml po Q 6 hrs for constipation  2. Safety and Monitoring: Voluntary admission to inpatient psychiatric unit for safety, stabilization and treatment Daily contact with patient to assess and evaluate symptoms and progress in treatment Patient's case to be discussed in multi-disciplinary team meeting Observation Level : q15 minute checks Vital signs: q12 hours Precautions: Safety  Discharge Planning: Social work and case management to assist with discharge planning and identification of hospital follow-up needs prior to discharge Estimated LOS: 5-7 days Discharge Concerns: Need to establish a safety plan; Medication compliance and effectiveness Discharge Goals: Return home with outpatient referrals for mental health follow-up including medication management/psychotherapy  Mac Bolster, NP, pmhnp, fnp-bc. 05/09/2024, 3:59 PM Patient ID: Robert Silva, male   DOB: November 22, 2000, 23 y.o.   MRN: [id number]

## 2024-05-09 NOTE — Plan of Care (Signed)
   Problem: Education: Goal: Verbalization of understanding the information provided will improve Outcome: Progressing   Problem: Activity: Goal: Interest or engagement in activities will improve Outcome: Progressing

## 2024-05-09 NOTE — Group Note (Signed)
 Date:  05/09/2024 Time:  2:16 PM  Group Topic/Focus: Recreational Therapy- Drumming Group    Pt did attend recreational therapy drumming group.   Antawn Sison R Dyanara Cozza 05/09/2024, 2:16 PM

## 2024-05-09 NOTE — Group Note (Signed)
 Date:  05/09/2024 Time:  11:50 AM  Group Topic/Focus: Social Work Group    Pt did attend social work group  Shaquasha Gerstel R Mycal Conde 05/09/2024, 11:50 AM

## 2024-05-09 NOTE — Group Note (Signed)
 Date:  05/09/2024 Time:  9:39 AM  Group Topic/Focus:  Goals Group:   The focus of this group is to help patients establish daily goals to achieve during treatment and discuss how the patient can incorporate goal setting into their daily lives to aide in recovery. Orientation:   The focus of this group is to educate the patient on the purpose and policies of crisis stabilization and provide a format to answer questions about their admission.  The group details unit policies and expectations of patients while admitted.    Participation Level:  Did Not Attend   Robert Silva 05/09/2024, 9:39 AM

## 2024-05-09 NOTE — Group Note (Signed)
 LCSW Group Therapy Note   Group Date: 05/09/2024 Start Time: 1100 End Time: 1200   Participation:  patient was present and actively participated in the discussion.  Type of Therapy:  Group Therapy  Topic: Healing Flames: Navigating Anger with Compassion  Objective:  Foster self-awareness and promote compassion toward oneself and others when dealing with anger.  Goals:  Help participants understand the underlying emotions and needs fueling anger. Provide coping strategies for healthier emotional expression and anger management.  Summary: This session explored anger as a volcano--an explosion driven by deeper feelings and unmet needs. Participants learned to identify anger triggers and underlying emotions, then practiced coping strategies like deep breathing, physical activity, and journaling. The group discussed healthy ways to manage anger before it escalates, using both personal reflection and shared experiences.  Therapeutic Modalities: Cognitive Behavioral Therapy (CBT): Challenging thoughts that fuel anger. Mindfulness: Increasing awareness of emotions and sensations.   Bich Mchaney O Mareesa Gathright, LCSWA 05/09/2024  12:06 PM

## 2024-05-09 NOTE — Group Note (Signed)
 Date:  05/09/2024 Time:  9:15 PM  Group Topic/Focus:  Self Care:   The focus of this group is to help patients understand the importance of self-care in order to improve or restore emotional, physical, spiritual, interpersonal, and financial health.    Pt did not attend group.  Deneisha Dade L 05/09/2024, 9:15 PM

## 2024-05-09 NOTE — [facility] Group Notes (Signed)
 [facility] Group Notes:  (Nursing/MHT/Case Management/Adjunct)  Date:  05/09/2024  Time:  8:50 PM  Type of Therapy:  Wrap up group  Participation Level:  Did Not Attend  Participation Quality:    Affect:    Cognitive:    Insight:    Engagement in Group:    Modes of Intervention:    Summary of Progress/Problems:  Grayce LITTIE Essex 05/09/2024, 8:50 PM

## 2024-05-09 NOTE — Group Note (Signed)
 Date:  05/09/2024 Time:  3:35 PM  Group Topic/Focus: Occupational Therapy     Pt did attend occupational therapy group  Robert Silva 05/09/2024, 3:35 PM

## 2024-05-09 NOTE — Group Note (Signed)
 Recreation Therapy Group Note   Group Topic:Other  Group Date: 05/09/2024 Start Time: 1305 End Time: 1350 Facilitators: Boyd Litaker-McCall, LRT,CTRS Location: [address] Dayroom   Activity Description/Intervention: Therapeutic Drumming. Patients with peers and staff were given the opportunity to engage in a leader facilitated [facility] Group Empowerment Drumming Circle with staff from the [employer], in partnership with [employer]. Teaching laboratory technician and trained [place]alt [place], Norleen Mon leading with LRT observing and documenting intervention and pt response. This evidenced-based practice targets 7 areas of health and wellbeing in the human experience including: stress-reduction, exercise, self-expression, camaraderie/support, nurturing, spirituality, and music-making (leisure).   Goal Area(s) Addresses:  Patient will engage in pro-social way in music group.  Patient will follow directions of drum leader on the first prompt. Patient will demonstrate no behavioral issues during group.  Patient will identify if a reduction in stress level occurs as a result of participation in therapeutic drum circle.    Education: Leisure exposure, Pharmacologist, Musical expression, Discharge Planning   Affect/Mood: Appropriate   Participation Level: Engaged   Participation Quality: Independent   Behavior: Appropriate   Speech/Thought Process: Focused   Insight: Good   Judgement: Good   Modes of Intervention: Teaching Laboratory Technician   Patient Response to Interventions:  Engaged   Education Outcome:  In group clarification offered    Clinical Observations/Individualized Feedback: Kristoffer actively engaged in therapeutic drumming exercise and discussions. Pt was appropriate with peers, staff, and musical equipment for duration of programming.  Pt identified thankful as their feeling after participation in music-based programming. Pt affect congruent with verbalized  emotion.    Plan: Continue to engage patient in RT group sessions 2-3x/week.   Melvena Vink-McCall, LRT,CTRS 05/09/2024 3:28 PM

## 2024-05-09 NOTE — Group Note (Signed)
 Therapy Group Note  Group Topic:Other  Group Date: 05/09/2024 Start Time: 1500 End Time: 1530 Facilitators: Reiner Loewen G, OT   The primary objective of this topic is to explore and understand the concept of occupational balance in the context of daily living. The term occupational balance is defined broadly, encompassing all activities that occupy an individual's time and energy, including self-care, leisure, and work-related tasks. The goal is to guide participants towards achieving a harmonious blend of these activities, tailored to their personal values and life circumstances. This balance is aimed at enhancing overall well-being, not by equally distributing time across activities, but by ensuring that daily engagements are fulfilling and not draining. The content delves into identifying various barriers that individuals face in achieving occupational balance, such as overcommitment, misaligned priorities, external pressures, and lack of effective time management. The impact of these barriers on occupational performance, roles, and lifestyles is examined, highlighting issues like reduced efficiency, strained relationships, and potential health problems. Strategies for cultivating occupational balance are a key focus. These strategies include practical methods like time blocking, prioritizing tasks, establishing self-care rituals, decluttering, connecting with nature, and engaging in reflective practices. These approaches are designed to be adaptable and applicable to a wide range of life scenarios, promoting a proactive and mindful approach to daily living. The overall aim is to equip participants with the knowledge and tools to create a balanced lifestyle that supports their mental, emotional, and physical health, thereby improving their functional performance in daily life.       Participation Level: Engaged   Participation Quality: Independent   Behavior: Hyperverbal    Speech/Thought Process: Loose association    Affect/Mood: Elevated   Insight: Limited   Judgement: Limited      Modes of Intervention: Education  Patient Response to Interventions:  Attentive   Plan: Continue to engage patient in OT groups 2 - 3x/week.  05/09/2024  Dallas KANDICE Purpura, OT  Dyon Rotert, OT

## 2024-05-09 NOTE — Progress Notes (Addendum)
 D. Pt has been friendly upon approach, presents animated- smiling - fidgety - loud at times- has been visible in the milieu, observed attending a couple of groups. Pt denies pain, withdrawal symptoms, SI/HI and A/VH and doesn't appear to be responding to internal stimuli.   A. Labs and vitals monitored. Pt given and educated on medications. Pt supported emotionally and encouraged to express concerns and ask questions.   R. Pt remains safe with 15 minute checks. Will continue POC.    05/09/24 0900  Psych Admission Type (Psych Patients Only)  Admission Status Voluntary  Psychosocial Assessment  Patient Complaints Anxiety  Eye Contact Fair  Facial Expression Animated  Affect Anxious  Speech Logical/coherent  Interaction Assertive  Motor Activity Other (Comment) (level 3 observation)  Appearance/Hygiene Unremarkable  Behavior Characteristics Cooperative  Mood Pleasant  Thought Process  Coherency WDL  Content WDL  Delusions None reported or observed  Perception WDL  Hallucination None reported or observed  Judgment Limited  Confusion None  Danger to Self  Current suicidal ideation? Denies  Self-Injurious Behavior No self-injurious ideation or behavior indicators observed or expressed   Danger to Others  Danger to Others None reported or observed

## 2024-05-09 NOTE — Group Note (Signed)
 Date:  05/09/2024 Time:  10:11 AM  Group Topic/Focus: Nutrition Group   Pt did not attend nutrition group  Jaystin Mcgarvey R Rocklyn Mayberry 05/09/2024, 10:11 AM

## 2024-05-10 DIAGNOSIS — F322 Major depressive disorder, single episode, severe without psychotic features: Principal | ICD-10-CM

## 2024-05-10 DIAGNOSIS — F1220 Cannabis dependence, uncomplicated: Secondary | ICD-10-CM

## 2024-05-10 DIAGNOSIS — J4520 Mild intermittent asthma, uncomplicated: Secondary | ICD-10-CM

## 2024-05-10 DIAGNOSIS — Z8659 Personal history of other mental and behavioral disorders: Secondary | ICD-10-CM

## 2024-05-10 MED ORDER — HYDROXYZINE HCL 25 MG PO TABS: 25.0000 mg | ORAL_TABLET | Freq: Three times a day (TID) | ORAL | 0 refills | Status: DC | PRN

## 2024-05-10 MED ORDER — SERTRALINE HCL 50 MG PO TABS: 50.0000 mg | ORAL_TABLET | Freq: Every day | ORAL | 0 refills | Status: DC

## 2024-05-10 MED ORDER — NICOTINE POLACRILEX 2 MG MT GUM: 2.0000 mg | CHEWING_GUM | OROMUCOSAL | Status: AC | PRN

## 2024-05-10 MED ORDER — LEVETIRACETAM ER 500 MG PO TB24: 500.0000 mg | ORAL_TABLET | Freq: Every day | ORAL | 0 refills | Status: DC

## 2024-05-10 MED ORDER — NICOTINE 14 MG/24HR TD PT24: 14.0000 mg | MEDICATED_PATCH | Freq: Every day | TRANSDERMAL | Status: AC

## 2024-05-10 MED ORDER — TRAZODONE HCL 50 MG PO TABS: 50.0000 mg | ORAL_TABLET | Freq: Every evening | ORAL | 0 refills | Status: DC | PRN

## 2024-05-10 MED ORDER — ALBUTEROL SULFATE HFA 108 (90 BASE) MCG/ACT IN AERS: 1.0000 | INHALATION_SPRAY | RESPIRATORY_TRACT | Status: DC | PRN

## 2024-05-10 NOTE — TOC Transition Note (Signed)
 05/10/2024  Robert Silva DOB: 2000/12/11 MRN: [id number]   RIDER WAIVER AND RELEASE OF LIABILITY  For the purposes of helping with transportation needs, [facility] partners with outside transportation providers (taxi companies, [place], catering manager.) to give [facility] patients or other approved people the choice of on-demand rides Public Librarian) to our buildings for non-emergency visits.  By using [employer], I, the person signing this document, on behalf of myself and/or any legal minors (in my care using the [employer]), agree:  Science Writer given to me are supplied by independent, outside transportation providers who do not work for, or have any affiliation with, [employer]. [facility] is not a transportation company. [facility] has no control over the quality or safety of the rides I get using [employer]. [facility] has no control over whether any outside ride will happen on time or not. [facility] gives no guarantee on the reliability, quality, safety, or availability on any rides, or that no mistakes will happen. I know and accept that traveling by vehicle (car, truck, SVU, fleeta, bus, taxi, etc.) has risks of serious injuries such as disability, being paralyzed, and death. I know and agree the risk of using [employer] is mine alone, and not [employer]. [employer] are provided as is and as are available. The transportation providers are in charge for all inspections and care of the vehicles used to provide these rides. I agree not to take legal action against [facility], its agents, employees, officers, directors, representatives, insurers, attorneys, assigns, successors, subsidiaries, and affiliates at any time for any reasons related directly or indirectly to using [employer]. I also agree not to take legal action against [facility] or its affiliates for any injury, death, or damage to property caused by or related to using  [employer]. I have read this Waiver and Release of Liability, and I understand the terms used in it and their legal meaning. This Waiver is freely and voluntarily given with the understanding that my right (or any legal minors) to legal action against [facility] relating to [employer] is knowingly given up to use these services.   I attest that I read the Ride Waiver and Release of Liability to Robert Silva, gave Mr. Eickhoff the opportunity to ask questions and answered the questions asked (if any). I affirm that Robert Silva then provided consent for assistance with transportation.

## 2024-05-10 NOTE — Progress Notes (Signed)
 Patient denies SI, HI, AVH. Patient stated they slept Good last night. Patient says they were anxious about when they were going to leave but scored 0/10 for feeling of hopelessness and depression. Patient has been cooperative and med compliant.     05/10/24 1100  Psych Admission Type (Psych Patients Only)  Admission Status Voluntary  Psychosocial Assessment  Patient Complaints Restlessness  Eye Contact Fair  Facial Expression Animated  Affect Silly  Speech Logical/coherent;Loud  Interaction Childlike  Motor Activity Other (Comment) (WDL)  Appearance/Hygiene Unremarkable  Behavior Characteristics Cooperative;Appropriate to situation;Restless  Mood Silly;Pleasant  Thought Process  Coherency WDL  Content WDL  Delusions None reported or observed  Perception WDL  Hallucination None reported or observed  Judgment Limited  Confusion None  Danger to Self  Current suicidal ideation? Denies  Self-Injurious Behavior No self-injurious ideation or behavior indicators observed or expressed   Agreement Not to Harm Self Yes  Description of Agreement Verbal  Danger to Others  Danger to Others None reported or observed

## 2024-05-10 NOTE — Discharge Summary (Signed)
 Physician Discharge Summary Note  Patient:  Robert Silva is an 23 y.o., male MRN:  [id number] DOB:  Apr 28, 2001 Patient phone:  [phone] (home)  Patient address:   [address]  [place] [address],   Total Time spent with patient: 45 minutes  Date of Admission:  05/06/2024 Date of Discharge: 05-10-24.  Reason for Admission: Reporting active suicidal ideation with a plan as well as homicidal ideation with a plan  Principal Problem: MDD (major depressive disorder), single episode, severe (HCC)  Discharge Diagnoses: Principal Problem:   MDD (major depressive disorder), single episode, severe (HCC) Active Problems:   Asthma, mild intermittent   History of seizure   Moderate tetrahydrocannabinol (THC) dependence (HCC)  Past Psychiatric History: See H&P.  Past Medical History:  Past Medical History:  Diagnosis Date   ADHD    Leg fracture, left    Leg fracture, right    History reviewed. No pertinent surgical history.  Family History: History reviewed. No pertinent family history.  Family Psychiatric  History: See H&P.  Social History:  Social History   Substance and Sexual Activity  Alcohol Use Yes     Social History   Substance and Sexual Activity  Drug Use Yes   Types: Marijuana, Crack cocaine    Social History   Socioeconomic History   Marital status: Single    Spouse name: Not on file   Number of children: Not on file   Years of education: Not on file   Highest education level: 11th grade  Occupational History   Not on file  Tobacco Use   Smoking status: Every Day    Types: Cigarettes    Passive exposure: Current   Smokeless tobacco: Never  Substance and Sexual Activity   Alcohol use: Yes   Drug use: Yes    Types: Marijuana, Crack cocaine   Sexual activity: Yes  Other Topics Concern   Not on file  Social History Narrative   ** Merged History Encounter **       Social Drivers of Health   Tobacco Use: High Risk (05/06/2024)    Patient History    Smoking Tobacco Use: Every Day    Smokeless Tobacco Use: Never    Passive Exposure: Current  Financial Resource Strain: High Risk (07/13/2023)   Overall Financial Resource Strain (CARDIA)    Difficulty of Paying Living Expenses: Hard  Food Insecurity: Food Insecurity Present (05/06/2024)   Epic    Worried About Programme Researcher, Broadcasting/film/video in the Last Year: Often true    Ran Out of Food in the Last Year: Often true  Transportation Needs: Unmet Transportation Needs (05/06/2024)   Epic    Lack of Transportation (Medical): Yes    Lack of Transportation (Non-Medical): Yes  Physical Activity: Insufficiently Active (07/13/2023)   Exercise Vital Sign    Days of Exercise per Week: 3 days    Minutes of Exercise per Session: 30 min  Stress: No Stress Concern Present (07/13/2023)   [employer] of Occupational Health - Occupational Stress Questionnaire    Feeling of Stress : Only a little  Social Connections: Moderately Integrated (02/01/2024)   Social Connection and Isolation Panel    Frequency of Communication with Friends and Family: Twice a week    Frequency of Social Gatherings with Friends and Family: Once a week    Attends Religious Services: 1 to 4 times per year    Active Member of [employer] or Organizations: No    Attends Banker Meetings: 1  to 4 times per year    Marital Status: Never married  Depression (PHQ2-9): High Risk (07/13/2023)   Depression (PHQ2-9)    PHQ-2 Score: 21  Alcohol Screen: Low Risk (05/06/2024)   Alcohol Screen    Last Alcohol Screening Score (AUDIT): 0  Housing: High Risk (05/06/2024)   Epic    Unable to Pay for Housing in the Last Year: Yes    Number of Times Moved in the Last Year: 6    Homeless in the Last Year: Yes  Utilities: Not At Risk (05/06/2024)   Epic    Threatened with loss of utilities: No  Health Literacy: Adequate Health Literacy (01/25/2024)   B1300 Health Literacy    Frequency of need for help with medical instructions:  Rarely   Hospital Course: (Per admission evaluation notes): 23 year old male with a history of adjustment disorder and ADHD who was admitted to the [facility] following evaluation at [facility]. He presented voluntarily, accompanied by his Hotel Manager, reporting active suicidal ideation with a plan as well as homicidal ideation with a plan in the context of significant psychosocial stressors, primarily homelessness. He reports previous suicide attempts. He denies any prior psychiatric hospitalizations. His medical history is notable for intermittent asthma and a seizure disorder.   Prior to this discharge, Robert Silva was seen & evaluated for improvement of symptoms. The current laboratory findings were reviewed (stable), nurses notes & vital signs were reviewed as well. There are no current mental health or medical issues that should prevent this discharge at this time. Patient is being discharged to continue mental health care & medication management as noted below.   After the above admission evaluation, Robert Silva's presenting symptoms were evaluated. He was recommended for mood stabilization treatments. The medication regimen targeting those presenting symptoms were discussed with him & initiated with his consent. He was medicated, stabilized & discharged on the medications as listed on his discharge medication lists below. Besides the mood stabilization treatments, he was was also enrolled & participated in the group counseling sessions being offered & held on this unit. He learned coping skills. He presented other significant pre-existing medical issues that required treatment. He was resumed & discharged on his pertinent home medication for that medical issue. He tolerated his treatment regimen without any adverse effects or reactions reported. Robert Silva's symptoms responded well to his treatment regimen warranting this discharge. Patient  is also mentally/medically stable & agreeable to this discharge.  During the course of this hospitalization, there were no instances of behavioral issues that required restraints or immediate intervention. Patient remained safe on the unit. There were no instances of self-harming behavior noted or reported by staff. There were no threats to other patients/staff. He remained cooperative to his daily routines & taking his treatment regimen as recommended by his treatment team. He participated in the group sessions and interacted with staff and the other patients appropriately. Over the course of this hospitalization, patient's symptoms responded well to his treatment regimen & his mood improved.  On this day of his hospital discharge, patient denies any thoughts of self-harm, suicidal/homicidal ideations. He is not observed to be responding to internal any internal stimuli. He has been compliant with his recommended treatment regimen. He is currently showing readiness for discharge & is future-oriented thinking. Patient is encouraged to keep all psychiatric appointments and continue with his medications as recommended. Emmons was able to engage in safety planning including plan to return to [facility] or  contact emergency services if he feels unable to maintain his own safety or the safety of others. Pt had no further questions, comments, or concerns. He left [facility] with all personal belongings in no apparent distress. Transportation per taxi. [facility] assisted with taxi fare.   Physical Findings: AIMS:  , ,  ,  ,  ,  ,   CIWA:  CIWA-Ar Total: 1 COWS:  COWS Total Score: 2  Musculoskeletal: Strength & Muscle Tone: within normal limits Gait & Station: normal Patient leans: N/A   Psychiatric Specialty Exam:  Presentation  General Appearance:  Casual; Fairly Groomed  Eye Contact: Good  Speech: Clear and Coherent (talkative.)  Speech Volume: Increased  Handedness: Right   Mood and Affect   Mood: Euphoric  Affect: Congruent   Thought Process  Thought Processes: Coherent; Goal Directed; Linear  Descriptions of Associations:Intact  Orientation:Full (Time, Place and Person)  Thought Content:Logical  History of Schizophrenia/Schizoaffective disorder:No  Duration of Psychotic Symptoms:No data recorded Hallucinations:Hallucinations: None  Ideas of Reference:None  Suicidal Thoughts:Suicidal Thoughts: No  Homicidal Thoughts:Homicidal Thoughts: No   Sensorium  Memory: Immediate Good; Recent Good; Remote Good  Judgment: Fair  Insight: Fair   Art Therapist  Concentration: Fair  Attention Span: Fair  Recall: Good  Fund of Knowledge: Fair  Language: Good   Psychomotor Activity  Psychomotor Activity: Psychomotor Activity: Increased   Assets  Assets: Communication Skills; Desire for Improvement; Physical Health; Resilience; Social Support   Sleep  Sleep: Sleep: Good  Estimated Sleeping Duration (Last 24 Hours): 7.00-8.50 hours   Physical Exam: Physical Exam Vitals and nursing note reviewed.  HENT:     Head: Normocephalic.     Nose: Nose normal.     Mouth/Throat:     Pharynx: Oropharynx is clear.  Cardiovascular:     Rate and Rhythm: Normal rate.     Pulses: Normal pulses.  Pulmonary:     Effort: Pulmonary effort is normal.  Genitourinary:    Comments: Deferred. Musculoskeletal:        General: Normal range of motion.     Cervical back: Normal range of motion.  Skin:    General: Skin is dry.  Neurological:     General: No focal deficit present.     Mental Status: He is alert and oriented to person, place, and time. Mental status is at baseline.    Review of Systems  Constitutional:  Negative for chills, diaphoresis and fever.  HENT:  Negative for congestion and sore throat.   Eyes:  Negative for blurred vision.  Respiratory:  Negative for cough, shortness of breath and wheezing.   Cardiovascular:  Negative  for chest pain and palpitations.  Gastrointestinal:  Negative for abdominal pain, constipation, diarrhea, heartburn, nausea and vomiting.  Genitourinary:  Negative for dysuria.  Musculoskeletal:  Negative for joint pain and myalgias.  Skin:  Negative for itching and rash.  Neurological:  Negative for dizziness, tingling, tremors, sensory change, speech change, focal weakness, seizures, loss of consciousness, weakness and headaches.  Endo/Heme/Allergies:        Allergies: See list.  Psychiatric/Behavioral:  Positive for depression (Hx of (stable).) and substance abuse (Hx THC use.). Negative for hallucinations, memory loss and suicidal ideas. The patient is not nervous/anxious and does not have insomnia.    Blood pressure 126/80, pulse 70, temperature 98 F (36.7 C), temperature source Oral, resp. rate 14, height 5' 7 (1.702 m), weight 75.6 kg, SpO2 100%. Body mass index is 26.09 kg/m.   Tobacco Use History[1] Tobacco  Cessation:  A prescription for an FDA-approved tobacco cessation medication provided at discharge  Blood Alcohol level:  Lab Results  Component Value Date   [facility] <15 05/06/2024   ETH <10 06/19/2022   Metabolic Disorder Labs:  Lab Results  Component Value Date   HGBA1C 4.2 (L) 05/08/2024   MPG 73.84 05/08/2024   No results found for: PROLACTIN Lab Results  Component Value Date   CHOL 139 05/08/2024   TRIG 53 05/08/2024   HDL 53 05/08/2024   CHOLHDL 2.6 05/08/2024   VLDL 11 05/08/2024   LDLCALC 75 05/08/2024    See Psychiatric Specialty Exam and Suicide Risk Assessment completed by Attending Physician prior to discharge.  Discharge destination:  Other:  [facility]  Is patient on multiple antipsychotic therapies at discharge:  No   Has Patient had three or more failed trials of antipsychotic monotherapy by history:  No  Recommended Plan for Multiple Antipsychotic Therapies: NA  Allergies as of 05/10/2024       Reactions   Fava Beans Anaphylaxis   Sulfa  Antibiotics Anaphylaxis   Fish Allergy Other (See Comments)   Reaction unknown   Justicia Adhatoda (malabar Nut Tree) [justicia Adhatoda] Other (See Comments)   Reaction unknown        Medication List     STOP taking these medications    nystatin  powder Commonly known as: MYCOSTATIN /NYSTOP        TAKE these medications      Indication  albuterol  108 (90 Base) MCG/ACT inhaler Commonly known as: VENTOLIN  HFA Inhale 1-2 puffs into the lungs every 4 (four) hours as needed for wheezing or shortness of breath. What changed:  how much to take when to take this  Indication: Asthma   hydrOXYzine  25 MG tablet Commonly known as: ATARAX  Take 1 tablet (25 mg total) by mouth 3 (three) times daily as needed for anxiety.  Indication: Feeling Anxious   levETIRAcetam  500 MG 24 hr tablet Commonly known as: KEPPRA  XR Take 1 tablet (500 mg total) by mouth daily. For seizures. Start taking on: May 11, 2024  Indication: Focal Epilepsy   nicotine  14 mg/24hr patch Commonly known as: NICODERM CQ  - dosed in mg/24 hours Place 1 patch (14 mg total) onto the skin daily. (May buy from over the counter): For smoking cessation.  Indication: Nicotine  Addiction   nicotine  polacrilex 2 MG gum Commonly known as: NICORETTE  Take 1 each (2 mg total) by mouth as needed. (May buy from over the counter): For smoking cessation.  Indication: Nicotine  Addiction   sertraline  50 MG tablet Commonly known as: ZOLOFT  Take 1 tablet (50 mg total) by mouth daily. For depression. Start taking on: May 11, 2024  Indication: Major Depressive Disorder   traZODone  50 MG tablet Commonly known as: DESYREL  Take 1 tablet (50 mg total) by mouth at bedtime as needed for sleep.  Indication: Trouble Sleeping        Follow-up Information     [facility] Go on 05/14/2024.   Specialty: Professional Counselor Why: Please go to this provider on 05/14/24 at 9:00 am to register for services.   At this time, you will be scheduled for a clinical assessment, in order to obtain a therapy appointment.   You may also go Monday thru Friday, from 9 am to 1 pm to register for services. Contact information: [address]  [address] [place] [address] [phone]         [facility]. Go on 05/21/2024.  Specialty: Behavioral Health Why: Please go to this provider on 05/21/24 at 7:00 am for an assessment, to obtain medication management services.  You may also go on Monday through Friday, arrive by 7:00 am. Contact information: [address] [place]  [address] [phone]        [facility] Follow up.   Why: You may also call this provider to schedule a hospital follow up appointment for therapy and medication management services.  The appointment will be Virtual. Contact information: [address]  [address] [place] [address] [phone]                Plan Of Care/Follow-up recommendations:  Activity: as tolerated Diet: heart healthy  Other: -Follow-up with your outpatient psychiatric provider -instructions on appointment date, time, and address (location) are provided to you in discharge paperwork.  -Take your psychiatric medications as prescribed at discharge - instructions are provided to you in the discharge paperwork  -Follow-up with outpatient primary care doctor and other specialists -for management of preventative medicine and chronic medical issues  -Testing: Follow-up with outpatient provider for abnormal lab results: NA  -If you are prescribed an atypical antipsychotic medication, we recommend that your outpatient psychiatrist follow routine screening for side effects within 3 months of discharge, including monitoring: AIMS scale, height, weight, blood pressure, fasting lipid panel, HbA1c, and fasting blood sugar.   -Recommend total abstinence from alcohol, tobacco, and other illicit drug use at  discharge.   -If your psychiatric symptoms recur, worsen, or if you have side effects to your psychiatric medications, call your outpatient psychiatric provider, 911, [phone] or go to the nearest emergency department.  -If suicidal thoughts occur, immediately call your outpatient psychiatric provider, 911, [phone] or go to the nearest emergency department.  Signed: Mac Bolster, NP, pmhnp, fnp-bc. 05/10/2024, 10:45 AM           [1]  Social History Tobacco Use  Smoking Status Every Day   Types: Cigarettes   Passive exposure: Current  Smokeless Tobacco Never

## 2024-05-10 NOTE — Progress Notes (Signed)
 Pt discharged to taxi. Pt was stable and appreciative at that time. All papers and prescriptions were given and valuables returned. Verbal understanding expressed. Denies SI/HI and A/VH. Pt given opportunity to express concerns and ask questions.

## 2024-05-10 NOTE — Progress Notes (Signed)
°  [facility] Adult Case Management Discharge Plan :  Will you be returning to the same living situation after discharge:  Yes,  Pt returning to [facility]. At discharge, do you have transportation home?: Yes,  Pt will be given taxi voucher to [facility]. Do you have the ability to pay for your medications: Yes,  Pt has medical insurance.  Release of information consent forms completed and in the chart;  Patient's signature needed at discharge.  Patient to Follow up at:  Follow-up Information     [facility]. Go on 05/14/2024.   Specialty: Professional Counselor Why: Please go to this provider on 05/14/24 at 9:00 am to register for services.  At this time, you will be scheduled for a clinical assessment, in order to obtain a therapy appointment.   You may also go Monday thru Friday, from 9 am to 1 pm to register for services. Contact information: [address]  [address] [place] [address] [phone]         [facility]. Go on 05/21/2024.   Specialty: Behavioral Health Why: Please go to this provider on 05/21/24 at 7:00 am for an assessment, to obtain medication management services.  You may also go on Monday through Friday, arrive by 7:00 am. Contact information: [address] [facility] [place]  [address] [phone]        [facility] Follow up.   Why: You may also call this provider to schedule a hospital follow up appointment for therapy and medication management services.  The appointment will be Virtual. Contact information: [address]  [address] [place] [address] [phone]                 Next level of care provider has access to [facility] Link:no  Safety Planning and Suicide Prevention discussed: Yes,  Completed with mother.     Has patient been referred to the Quitline?: Patient refused referral for treatment  Patient has been referred for addiction treatment: Patient refused referral for  treatment.  Robert JONELLE Blanch, LCSW 05/10/2024, 11:30 AM

## 2024-05-10 NOTE — [facility] Suicide Risk Assessment (Signed)
 Suicide Risk Assessment  Discharge Assessment    [facility] Discharge Suicide Risk Assessment   Principal Problem: MDD (major depressive disorder), single episode, severe (HCC)  Discharge Diagnoses: Principal Problem:   MDD (major depressive disorder), single episode, severe (HCC) Active Problems:   Asthma, mild intermittent   History of seizure   Moderate tetrahydrocannabinol (THC) dependence (HCC)  Total Time spent with patient: 45 minutes  Musculoskeletal: Strength & Muscle Tone: within normal limits Gait & Station: normal Patient leans: N/A  Psychiatric Specialty Exam  Presentation  General Appearance:  Casual; Fairly Groomed  Eye Contact: Good  Speech: Clear and Coherent (talkative.)  Speech Volume: Increased  Handedness: Right  Mood and Affect  Mood: Euphoric  Duration of Depression Symptoms: Greater than two weeks  Affect: Congruent  Thought Process  Thought Processes: Coherent; Goal Directed; Linear  Descriptions of Associations:Intact  Orientation:Full (Time, Place and Person)  Thought Content:Logical  History of Schizophrenia/Schizoaffective disorder:No  Duration of Psychotic Symptoms:No data recorded Hallucinations:Hallucinations: None  Ideas of Reference:None  Suicidal Thoughts:Suicidal Thoughts: No  Homicidal Thoughts:Homicidal Thoughts: No  Sensorium  Memory: Immediate Good; Recent Good; Remote Good  Judgment: Fair  Insight: Fair  Art Therapist  Concentration: Fair  Attention Span: Fair  Recall: Good  Fund of Knowledge: Fair  Language: Good  Psychomotor Activity  Psychomotor Activity: Psychomotor Activity: Increased  Assets  Assets: Communication Skills; Desire for Improvement; Physical Health; Resilience; Social Support  Sleep  Sleep: Sleep: Good  Estimated Sleeping Duration (Last 24 Hours): 7.25-8.75 hours  Physical Exam: See Discharge summary.  Blood pressure 126/80, pulse 70, temperature  98 F (36.7 C), temperature source Oral, resp. rate 14, height 5' 7 (1.702 m), weight 75.6 kg, SpO2 100%. Body mass index is 26.09 kg/m.  Mental Status Per Nursing Assessment::   On Admission:  Suicidal ideation indicated by patient  Demographic Factors:  Male, Adolescent or young adult, Low socioeconomic status, and Unemployed  Loss Factors: Financial problems/change in socioeconomic status  Historical Factors: Impulsivity  Risk Reduction Factors:   Sense of responsibility to family, Positive social support, Positive therapeutic relationship, and Positive coping skills or problem solving skills  Continued Clinical Symptoms:  Depression:   Comorbid alcohol abuse/dependence Impulsivity Alcohol/Substance Abuse/Dependencies  Cognitive Features That Contribute To Risk:  Polarized thinking and Thought constriction (tunnel vision)    Suicide Risk:  Minimal: No identifiable suicidal ideation.  Patients presenting with no risk factors but with morbid ruminations; may be classified as minimal risk based on the severity of the depressive symptoms   Follow-up Information     [facility] Go on 05/14/2024.   Specialty: Professional Counselor Why: Please go to this provider on 05/14/24 at 9:00 am to register for services.  At this time, you will be scheduled for a clinical assessment, in order to obtain a therapy appointment.   You may also go Monday thru Friday, from 9 am to 1 pm to register for services. Contact information: [address]  [address] [place] [address] [phone]         [facility]. Go on 05/21/2024.   Specialty: Behavioral Health Why: Please go to this provider on 05/21/24 at 7:00 am for an assessment, to obtain medication management services.  You may also go on Monday through Friday, arrive by 7:00 am. Contact information: [address] [place]  [address] [phone]        [facility]  Follow up.   Why: You may also call this provider to schedule  a hospital follow up appointment for therapy and medication management services.  The appointment will be Virtual. Contact information: [address]  [address] [place] [address] [phone]                 Plan Of Care/Follow-up recommendations:  See the discharge recommendations above.  Mac Bolster, NP, pmhnp, fnp-bc. 05/10/2024, 10:42 AM

## 2024-05-10 NOTE — Group Note (Signed)
 Date:  05/10/2024 Time:  10:02 AM  Group Topic/Focus: Self assessment orientation group Goals Group:   The focus of this group is to help patients establish daily goals to achieve during treatment and discuss how the patient can incorporate goal setting into their daily lives to aide in recovery. Self Care:   The focus of this group is to help patients understand the importance of self-care in order to improve or restore emotional, physical, spiritual, interpersonal, and financial health.    Participation Level:  Active  Participation Quality:  Appropriate  Affect:  Appropriate  Cognitive:  Alert and Appropriate  [name] 05/10/2024, 10:02 AM

## 2024-05-10 NOTE — Plan of Care (Signed)
   Problem: Education: Goal: Knowledge of [facility] Education information/materials will improve Outcome: Progressing Goal: Emotional status will improve Outcome: Progressing Goal: Mental status will improve Outcome: Progressing Goal: Verbalization of understanding the information provided will improve Outcome: Progressing   Problem: Activity: Goal: Interest or engagement in activities will improve Outcome: Progressing

## 2024-05-10 NOTE — Group Note (Signed)
 Recreation Therapy Group Note   Group Topic:Leisure Education  Group Date: 05/10/2024 Start Time: 0930 End Time: 1002 Facilitators: Sumaiya Arruda-McCall, LRT,CTRS Location: [address]   Group Topic: Leisure Education   Goal Area(s) Addresses:  Patient will successfully identify positive leisure and recreation activities.  Patient will acknowledge benefits of participation in healthy leisure activities post discharge.  Patient will actively work with peers toward a shared goal.   Behavioral Response: Engaged   Intervention: Competitive Group Game   Activity: Guess the [employer]. The game is divided into 6 categories (Pop, R&B, Rock, Hip Hop, Dance and Indie). In teams of 3-4, patients took turns spinning the wheel. Whatever category the needle landed on, the group would pull a card from that stack. One of group members reads the lyric to their group. Their group has to guess the missing lyric. If they get the correct answer, they keep the card. If not, the other team gets the chance to steal the point. The team with the most cards at the end wins the game.   Education: Teacher, English As A Foreign Language, Stress Management, Discharge Planning  Education Outcome: Acknowledges education/In group clarification offered/Needs additional education   Affect/Mood: N/A   Participation Level: Did not attend    Clinical Observations/Individualized Feedback:      Plan: Continue to engage patient in RT group sessions 2-3x/week.   Percell Lamboy-McCall, LRT,CTRS  05/10/2024 11:31 AM

## 2024-05-10 NOTE — Progress Notes (Signed)
 Shift Note  (Sleep Hours) - 11.25  (Any PRNs that were needed, meds refused, or side effects to meds)-   PO PRN Trazodone - Insomnia   PO PRN - Anxiety    (Any disturbances and when (visitation, over night)- None  (Concerns raised by the patient)- None  (SI/HI/AVH)- Denies

## 2024-05-10 NOTE — Group Note (Signed)
 Date:  05/10/2024 Time:  10:22 AM  Group Topic/Focus: recreational therapy played finished the lyric  Guess the Lyrics can be a fun and effective tool for mental health and critical thinking by engaging individuals with emotional content, boosting memory, and fostering empathy. By guessing missing lyrics from songs that address themes like resilience, love, or struggle, participants can connect with their own emotions, reflect on their personal experiences, and practice cognitive flexibility. Discussing the meaning of the lyrics afterward encourages deeper self-awareness and perspective-taking, promoting mental well-being. This activity also offers a creative outlet for self-expression and can serve as a conversation starter on important topics like vulnerability, hope, and personal growth.    Participation Level:  Active   Robert Silva HERO [name] 05/10/2024, 10:22 AM

## 2024-05-13 ENCOUNTER — Other Ambulatory Visit: Payer: Self-pay

## 2024-05-14 ENCOUNTER — Other Ambulatory Visit: Payer: Self-pay | Admitting: *Deleted

## 2024-05-14 ENCOUNTER — Other Ambulatory Visit: Payer: Self-pay

## 2024-05-14 DIAGNOSIS — J4520 Mild intermittent asthma, uncomplicated: Secondary | ICD-10-CM

## 2024-05-14 DIAGNOSIS — G40909 Epilepsy, unspecified, not intractable, without status epilepticus: Secondary | ICD-10-CM

## 2024-05-14 DIAGNOSIS — F332 Major depressive disorder, recurrent severe without psychotic features: Secondary | ICD-10-CM

## 2024-05-14 MED ORDER — LEVETIRACETAM ER 500 MG PO TB24
500.0000 mg | ORAL_TABLET | Freq: Every day | ORAL | 0 refills | Status: DC
  Filled 2024-05-14: qty 30, 30d supply, fill #0

## 2024-05-14 MED ORDER — ALBUTEROL SULFATE HFA 108 (90 BASE) MCG/ACT IN AERS
1.0000 | INHALATION_SPRAY | RESPIRATORY_TRACT | 0 refills | Status: AC | PRN
  Filled 2024-05-14: qty 18, 17d supply, fill #0

## 2024-05-14 MED ORDER — TRAZODONE HCL 50 MG PO TABS
50.0000 mg | ORAL_TABLET | Freq: Every evening | ORAL | 0 refills | Status: DC | PRN
  Filled 2024-05-14: qty 30, 30d supply, fill #0

## 2024-05-14 MED ORDER — SERTRALINE HCL 50 MG PO TABS
50.0000 mg | ORAL_TABLET | Freq: Every day | ORAL | 0 refills | Status: DC
  Filled 2024-05-14: qty 30, 30d supply, fill #0

## 2024-05-14 MED ORDER — HYDROXYZINE HCL 25 MG PO TABS
25.0000 mg | ORAL_TABLET | Freq: Three times a day (TID) | ORAL | 0 refills | Status: DC | PRN
  Filled 2024-05-14: qty 75, 25d supply, fill #0

## 2024-05-14 NOTE — Progress Notes (Signed)
 Came in for help with meds that were prescribed at time of discharge.  CCN Robert Silva) talked with him and will pick up his meds. Continue to follow

## 2024-05-15 ENCOUNTER — Other Ambulatory Visit: Payer: Self-pay

## 2024-05-16 NOTE — Congregational Nurse Program (Signed)
°  Dept: [phone]   Congregational Nurse Program Note  Date of Encounter: 05/16/2024  Past Medical History: Past Medical History:  Diagnosis Date   ADHD    Leg fracture, left    Leg fracture, right     Encounter Details:   Client to RN office. RN picked up medications for client. RN reviewed and educated client on medication and importance of compliance. Client verbalized understanding. Client denied RN help placing meds in daily container. Spiritual and emotional care provided. Will continue to follow. Client denies any other acute needs at this time.

## 2024-05-20 ENCOUNTER — Other Ambulatory Visit: Payer: Self-pay | Admitting: *Deleted

## 2024-05-20 ENCOUNTER — Other Ambulatory Visit: Payer: Self-pay

## 2024-05-20 DIAGNOSIS — F332 Major depressive disorder, recurrent severe without psychotic features: Secondary | ICD-10-CM

## 2024-05-20 DIAGNOSIS — J4520 Mild intermittent asthma, uncomplicated: Secondary | ICD-10-CM

## 2024-05-20 DIAGNOSIS — G40909 Epilepsy, unspecified, not intractable, without status epilepticus: Secondary | ICD-10-CM

## 2024-05-20 MED ORDER — HYDROXYZINE HCL 25 MG PO TABS
25.0000 mg | ORAL_TABLET | Freq: Three times a day (TID) | ORAL | 0 refills | Status: DC | PRN
  Filled 2024-05-20: qty 75, 25d supply, fill #0

## 2024-05-20 MED ORDER — TRAZODONE HCL 50 MG PO TABS
50.0000 mg | ORAL_TABLET | Freq: Every evening | ORAL | 0 refills | Status: DC | PRN
  Filled 2024-05-20: qty 30, 30d supply, fill #0

## 2024-05-20 MED ORDER — SERTRALINE HCL 50 MG PO TABS
50.0000 mg | ORAL_TABLET | Freq: Every day | ORAL | 0 refills | Status: DC
  Filled 2024-05-20: qty 30, 30d supply, fill #0

## 2024-05-20 MED ORDER — LEVETIRACETAM ER 500 MG PO TB24
500.0000 mg | ORAL_TABLET | Freq: Every day | ORAL | 0 refills | Status: DC
  Filled 2024-05-20: qty 30, 30d supply, fill #0
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
## 2024-05-24 ENCOUNTER — Ambulatory Visit (HOSPITAL_COMMUNITY)
Admission: EM | Admit: 2024-05-24 | Discharge: 2024-05-25 | Disposition: A | Discharge status: Home / Self Care | Payer: MEDICAID

## 2024-05-24 ENCOUNTER — Encounter (HOSPITAL_COMMUNITY): Payer: Self-pay | Admitting: Emergency Medicine

## 2024-05-24 ENCOUNTER — Other Ambulatory Visit: Payer: Self-pay

## 2024-05-24 DIAGNOSIS — F1410 Cocaine abuse, uncomplicated: Secondary | ICD-10-CM | POA: Diagnosis not present

## 2024-05-24 DIAGNOSIS — F322 Major depressive disorder, single episode, severe without psychotic features: Secondary | ICD-10-CM

## 2024-05-24 DIAGNOSIS — F1220 Cannabis dependence, uncomplicated: Secondary | ICD-10-CM | POA: Diagnosis not present

## 2024-05-24 DIAGNOSIS — F332 Major depressive disorder, recurrent severe without psychotic features: Secondary | ICD-10-CM

## 2024-05-24 DIAGNOSIS — F4310 Post-traumatic stress disorder, unspecified: Secondary | ICD-10-CM | POA: Insufficient documentation

## 2024-05-24 DIAGNOSIS — F411 Generalized anxiety disorder: Secondary | ICD-10-CM | POA: Insufficient documentation

## 2024-05-24 DIAGNOSIS — R001 Bradycardia, unspecified: Secondary | ICD-10-CM | POA: Diagnosis not present

## 2024-05-24 DIAGNOSIS — F17210 Nicotine dependence, cigarettes, uncomplicated: Secondary | ICD-10-CM | POA: Insufficient documentation

## 2024-05-24 DIAGNOSIS — R45851 Suicidal ideations: Secondary | ICD-10-CM | POA: Insufficient documentation

## 2024-05-24 DIAGNOSIS — Z5901 Sheltered homelessness: Secondary | ICD-10-CM | POA: Insufficient documentation

## 2024-05-24 DIAGNOSIS — Z5900 Homelessness unspecified: Secondary | ICD-10-CM

## 2024-05-24 DIAGNOSIS — F909 Attention-deficit hyperactivity disorder, unspecified type: Secondary | ICD-10-CM | POA: Diagnosis not present

## 2024-05-24 DIAGNOSIS — Z9152 Personal history of nonsuicidal self-harm: Secondary | ICD-10-CM | POA: Insufficient documentation

## 2024-05-24 DIAGNOSIS — R4587 Impulsiveness: Secondary | ICD-10-CM | POA: Insufficient documentation

## 2024-05-24 DIAGNOSIS — G40909 Epilepsy, unspecified, not intractable, without status epilepticus: Secondary | ICD-10-CM | POA: Diagnosis not present

## 2024-05-24 LAB — URINALYSIS, ROUTINE W REFLEX MICROSCOPIC
Bilirubin Urine: NEGATIVE
Glucose, UA: NEGATIVE mg/dL
Hgb urine dipstick: NEGATIVE
Ketones, ur: NEGATIVE mg/dL
Leukocytes,Ua: NEGATIVE
Nitrite: NEGATIVE
Protein, ur: NEGATIVE mg/dL
Specific Gravity, Urine: 1.004 — ABNORMAL LOW (ref 1.005–1.030)
pH: 7 (ref 5.0–8.0)

## 2024-05-24 LAB — CBC WITH DIFFERENTIAL/PLATELET
Abs Immature Granulocytes: 0.02 K/uL (ref 0.00–0.07)
Basophils Absolute: 0 K/uL (ref 0.0–0.1)
Basophils Relative: 1 %
Eosinophils Absolute: 0.2 K/uL (ref 0.0–0.5)
Eosinophils Relative: 2 %
HCT: 37.3 % — ABNORMAL LOW (ref 39.0–52.0)
Hemoglobin: 12.3 g/dL — ABNORMAL LOW (ref 13.0–17.0)
Immature Granulocytes: 0 %
Lymphocytes Relative: 31 %
Lymphs Abs: 2.7 K/uL (ref 0.7–4.0)
MCH: 31.1 pg (ref 26.0–34.0)
MCHC: 33 g/dL (ref 30.0–36.0)
MCV: 94.2 fL (ref 80.0–100.0)
Monocytes Absolute: 0.7 K/uL (ref 0.1–1.0)
Monocytes Relative: 8 %
Neutro Abs: 5.1 K/uL (ref 1.7–7.7)
Neutrophils Relative %: 58 %
Platelets: 206 K/uL (ref 150–400)
RBC: 3.96 MIL/uL — ABNORMAL LOW (ref 4.22–5.81)
RDW: 13 % (ref 11.5–15.5)
WBC: 8.7 K/uL (ref 4.0–10.5)
nRBC: 0 % (ref 0.0–0.2)

## 2024-05-24 LAB — POCT URINE DRUG SCREEN - MANUAL ENTRY (I-SCREEN)
POC Amphetamine UR: NOT DETECTED
POC Buprenorphine (BUP): NOT DETECTED
POC Cocaine UR: NOT DETECTED
POC Marijuana UR: POSITIVE — AB
POC Methadone UR: NOT DETECTED
POC Methamphetamine UR: NOT DETECTED
POC Morphine: NOT DETECTED
POC Oxazepam (BZO): NOT DETECTED
POC Oxycodone UR: NOT DETECTED
POC Secobarbital (BAR): NOT DETECTED

## 2024-05-24 LAB — COMPREHENSIVE METABOLIC PANEL WITH GFR
ALT: 20 U/L (ref 0–44)
AST: 26 U/L (ref 15–41)
Albumin: 4 g/dL (ref 3.5–5.0)
Alkaline Phosphatase: 94 U/L (ref 38–126)
Anion gap: 10 (ref 5–15)
BUN: 10 mg/dL (ref 6–20)
CO2: 28 mmol/L (ref 22–32)
Calcium: 9.4 mg/dL (ref 8.9–10.3)
Chloride: 102 mmol/L (ref 98–111)
Creatinine, Ser: 0.65 mg/dL (ref 0.61–1.24)
GFR, Estimated: >60 mL/min
Glucose, Bld: 106 mg/dL — ABNORMAL HIGH (ref 70–99)
Potassium: 3.9 mmol/L (ref 3.5–5.1)
Sodium: 139 mmol/L (ref 135–145)
Total Bilirubin: 0.7 mg/dL (ref 0.0–1.2)
Total Protein: 6.7 g/dL (ref 6.5–8.1)

## 2024-05-24 LAB — LIPID PANEL
Cholesterol: 120 mg/dL (ref 0–200)
HDL: 48 mg/dL
LDL Cholesterol: 49 mg/dL (ref 0–99)
Total CHOL/HDL Ratio: 2.5 ratio
Triglycerides: 117 mg/dL
VLDL: 23 mg/dL (ref 0–40)

## 2024-05-24 LAB — TSH: TSH: 1.35 u[IU]/mL (ref 0.350–4.500)

## 2024-05-24 MED ORDER — LEVETIRACETAM ER 500 MG PO TB24
500.0000 mg | ORAL_TABLET | Freq: Every day | ORAL | Status: DC
  Administered 2024-05-24: 500 mg via ORAL
  Filled 2024-05-24: qty 1

## 2024-05-24 MED ORDER — DIPHENHYDRAMINE HCL 50 MG/ML IJ SOLN: 50.0000 mg | Freq: Three times a day (TID) | INTRAMUSCULAR | Status: DC | PRN

## 2024-05-24 MED ORDER — HALOPERIDOL 5 MG PO TABS: 5.0000 mg | ORAL_TABLET | Freq: Three times a day (TID) | ORAL | Status: DC | PRN

## 2024-05-24 MED ORDER — DIPHENHYDRAMINE HCL 50 MG PO CAPS: 50.0000 mg | ORAL_CAPSULE | Freq: Three times a day (TID) | ORAL | Status: DC | PRN

## 2024-05-24 MED ORDER — ACETAMINOPHEN 325 MG PO TABS: 650.0000 mg | ORAL_TABLET | Freq: Four times a day (QID) | ORAL | Status: DC | PRN

## 2024-05-24 MED ORDER — LORAZEPAM 2 MG/ML IJ SOLN: 2.0000 mg | Freq: Three times a day (TID) | INTRAMUSCULAR | Status: DC | PRN

## 2024-05-24 MED ORDER — TRAZODONE HCL 50 MG PO TABS: 50.0000 mg | ORAL_TABLET | Freq: Every evening | ORAL | Status: DC | PRN

## 2024-05-24 MED ORDER — HYDROXYZINE HCL 25 MG PO TABS: 25.0000 mg | ORAL_TABLET | Freq: Three times a day (TID) | ORAL | Status: DC | PRN

## 2024-05-24 MED ORDER — MAGNESIUM HYDROXIDE 400 MG/5ML PO SUSP: 30.0000 mL | Freq: Every day | ORAL | Status: DC | PRN

## 2024-05-24 MED ORDER — HALOPERIDOL LACTATE 5 MG/ML IJ SOLN: 10.0000 mg | Freq: Three times a day (TID) | INTRAMUSCULAR | Status: DC | PRN

## 2024-05-24 MED ORDER — SERTRALINE HCL 50 MG PO TABS
50.0000 mg | ORAL_TABLET | Freq: Every day | ORAL | Status: DC
  Administered 2024-05-24: 50 mg via ORAL
  Filled 2024-05-24: qty 1

## 2024-05-24 MED ORDER — ALUM & MAG HYDROXIDE-SIMETH 200-200-20 MG/5ML PO SUSP: 30.0000 mL | ORAL | Status: DC | PRN

## 2024-05-24 MED ORDER — HALOPERIDOL LACTATE 5 MG/ML IJ SOLN: 5.0000 mg | Freq: Three times a day (TID) | INTRAMUSCULAR | Status: DC | PRN
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
## 2024-05-24 NOTE — ED Provider Notes (Signed)
 [facility] Continuous Assessment Admission H&P  Date: 05/24/2024 Patient Name: Robert Silva MRN: [id number] Chief Complaint: Me and my girlfriend got into an argument, I tried to cut myself  Diagnoses:  Final diagnoses:  None    HPI: Robert Silva is a 23 year old male patient with a history of MDD, GAD, PTSD, cocaine use disorder, marijuana use disorder and seizures that presented to the [facility] for worsening depression and suicidal ideation.   Patient reported that he has been having depression due to arguments with my girlfriend, reported that This is the first christmas I spent without my kids. He endorsed Suicidal ideation with a plan to overdose on all my medications. Stated that he has been compliant on all the medications including trazodone , sertraline , hydroxyzine  and Keppra .  Reported that he would take all his medications and overdose himself, denied any previous overdose attempts but reported cutting behaviors.  He had a superficial cut on his right arm I did it yesterday.  He had an argument she thought I was cheating, and she was locked up I was in relationship with her another woman.  He reported symptoms of decreased interest in daily activities, decreased energy, reduced appetite and passive suicidal ideations however denied any issues with sleep okay when asked about how his sleep was.  He also reported, frequent flashbacks about traumatic events growing up my mom and my dad had bad relationship, and my past relationships .  He denied any nightmares.  When asked about anxiety he stated no anxiety , he denied any homicidal ideations, denied any auditory or visual hallucinations. Reported that he has been having a lot of relationship issues, impulsive behaviors including drugs in the past, fear of abandonment, identity issues, mood fluctuations throughout the day, feeling chronically empty, cutting behaviors since age of 46 and making efforts to avoid abandonment with his  relationships.  Reported that he has been smoking marijuana every day 1 blunt, smoking ADHD, denied using any other substances including alcohol.  Reported that he used cocaine 2 months ago however quit since then.  We discussed about admission to the observation unit and continuing on the medications.  He denied any side effects or any new physical concerns.  Total Time spent with patient: 45 minutes  Musculoskeletal  Strength & Muscle Tone: within normal limits Gait & Station: normal Patient leans: N/A  Psychiatric Specialty Exam  Presentation General Appearance:  Casual; Fairly Groomed  Eye Contact: Fair  Speech: Clear and Coherent  Speech Volume: Normal  Handedness: Right   Mood and Affect  Mood: Depressed; Worthless  Affect: Depressed   Thought Process  Thought Processes: Linear  Descriptions of Associations:Intact  Orientation:Full (Time, Place and Person)  Thought Content:Logical  Diagnosis of Schizophrenia or Schizoaffective disorder in past: No   Hallucinations:Hallucinations: None  Ideas of Reference:None  Suicidal Thoughts:Suicidal Thoughts: Yes, Passive SI Active Intent and/or Plan: With Intent; Without Means to Carry Out; Without Access to Means SI Passive Intent and/or Plan: With Intent; Without Means to Carry Out; Without Access to Means  Homicidal Thoughts:Homicidal Thoughts: No   Sensorium  Memory: Immediate Good  Judgment: Poor  Insight: Poor   Executive Functions  Concentration: Fair  Attention Span: Fair  Recall: Fair  Fund of Knowledge: Fair  Language: Fair   Psychomotor Activity  Psychomotor Activity:Psychomotor Activity: Normal   Assets  Assets: Manufacturing Systems Engineer; Desire for Improvement   Sleep  Sleep:Sleep: Fair   Nutritional Assessment (For OBS and FBC admissions only) Has the patient had  a weight loss or gain of 10 pounds or more in the last 3 months?: No Has the patient had a  decrease in food intake/or appetite?: No Does the patient have dental problems?: No Does the patient have eating habits or behaviors that may be indicators of an eating disorder including binging or inducing vomiting?: No Has the patient recently lost weight without trying?: 0 Has the patient been eating poorly because of a decreased appetite?: 0 Malnutrition Screening Tool Score: 0    Physical Exam Vitals and nursing note reviewed.  Constitutional:      General: He is not in acute distress.    Appearance: He is well-developed.  HENT:     Head: Normocephalic and atraumatic.  Eyes:     Conjunctiva/sclera: Conjunctivae normal.  Cardiovascular:     Rate and Rhythm: Normal rate and regular rhythm.     Heart sounds: No murmur heard. Pulmonary:     Effort: Pulmonary effort is normal. No respiratory distress.     Breath sounds: Normal breath sounds.  Abdominal:     Palpations: Abdomen is soft.     Tenderness: There is no abdominal tenderness.  Musculoskeletal:        General: No swelling.     Cervical back: Neck supple.  Skin:    General: Skin is warm and dry.     Capillary Refill: Capillary refill takes less than 2 seconds.  Neurological:     Mental Status: He is alert.  Psychiatric:        Mood and Affect: Mood normal.    Review of Systems  Constitutional:  Negative for chills, fever, malaise/fatigue and weight loss.  HENT:  Negative for ear pain, hearing loss and tinnitus.   Eyes:  Negative for blurred vision, double vision and photophobia.  Respiratory:  Negative for cough and hemoptysis.   Cardiovascular:  Negative for chest pain, palpitations and orthopnea.  Gastrointestinal:  Negative for abdominal pain, heartburn, nausea and vomiting.  Genitourinary:  Negative for dysuria, frequency and urgency.  Musculoskeletal:  Negative for myalgias and neck pain.  Skin:  Negative for rash.  Neurological:  Negative for dizziness, tingling, tremors, weakness and headaches.   Endo/Heme/Allergies:  Negative for environmental allergies. Does not bruise/bleed easily.  Psychiatric/Behavioral:  Positive for depression and suicidal ideas. Negative for hallucinations. The patient is not nervous/anxious.     Blood pressure 124/80, pulse 74, temperature 98.3 F (36.8 C), temperature source Oral, resp. rate 18, SpO2 100%. There is no height or weight on file to calculate BMI.  Past Psychiatric History: ADHD, Adjustment disorder, MDD, GAD   Is the patient at risk to self? Yes  Has the patient been a risk to self in the past 6 months? Yes .    Has the patient been a risk to self within the distant past? Yes   Is the patient a risk to others? Yes   Has the patient been a risk to others in the past 6 months? Yes   Has the patient been a risk to others within the distant past? No   Past Medical History:  Past Medical History:  Diagnosis Date   ADHD    Leg fracture, left    Leg fracture, right      Family History: Mother has bipolar disorder, MDD and Father has Schizophrenia  Social History: He is currently homeless, resides at [employer] [facility]. Is in CNP for assessments  Last Labs:  Admission on 05/06/2024, Discharged on 05/10/2024  Component Date Value Ref Range  Status   HIV Screen 4th Generation wRfx 05/07/2024 Non Reactive  Non Reactive Final   Performed at [facility], [address], [place], [place] [address]   Vit D, 25-Hydroxy 05/08/2024 26.34 (L)  30 - 100 ng/mL Final   Comment: (NOTE) Vitamin D  deficiency has been defined by the Institute of Medicine  and an Endocrine Society practice guideline as a level of serum 25-OH  vitamin D  less than 20 ng/mL (1,2). The Endocrine Society went on to  further define vitamin D  insufficiency as a level between 21 and 29  ng/mL (2).  1. IOM (Institute of Medicine). 2010. Dietary reference intakes for  calcium and D. [place]: [employer]. 2. Holick MF, Binkley [place], Bischoff-Ferrari HA, et al.  Evaluation,  treatment, and prevention of vitamin D  deficiency: an Endocrine  Society clinical practice guideline, JCEM. 2011 Jul; 96(7): 1911-30.  Performed at [facility], [address], [place], [place] [address]    Hgb A1c MFr Bld 05/08/2024 4.2 (L)  4.8 - 5.6 % Final   Comment: (NOTE) Diagnosis of Diabetes The following HbA1c ranges recommended by the American Diabetes Association (ADA) may be used as an aid in the diagnosis of diabetes mellitus.  Hemoglobin             Suggested A1C NGSP%              Diagnosis  <5.7                   Non Diabetic  5.7-6.4                Pre-Diabetic  >6.4                   Diabetic  <7.0                   Glycemic control for                       adults with diabetes.     Mean Plasma Glucose 05/08/2024 73.84  mg/dL Final   Performed at [facility], [address] [place], [place] [address]   Cholesterol 05/08/2024 139  0 - 200 mg/dL Final   Comment:        ATP III CLASSIFICATION:  <200     mg/dL   Desirable  799-760  mg/dL   Borderline High  >=759    mg/dL   High           Triglycerides 05/08/2024 53  <150 mg/dL Final   HDL 87/89/7974 53  >40 mg/dL Final   Total CHOL/HDL Ratio 05/08/2024 2.6  RATIO Final   VLDL 05/08/2024 11  0 - 40 mg/dL Final   LDL Cholesterol 05/08/2024 75  0 - 99 mg/dL Final   Comment:        Total Cholesterol/HDL:CHD Risk Coronary Heart Disease Risk Table                     Men   Women  1/2 Average Risk   3.4   3.3  Average Risk       5.0   4.4  2 X Average Risk   9.6   7.1  3 X Average Risk  23.4   11.0        Use the calculated Patient Ratio above and the CHD Risk Table to determine the patient's CHD  Risk.        ATP III CLASSIFICATION (LDL):  <100     mg/dL   Optimal  899-870  mg/dL   Near or Above                    Optimal  130-159  mg/dL   Borderline  839-810  mg/dL   High  >809     mg/dL   Very High Performed at [facility], [address],  [place], [place] [address]    RPR Ser Ql 05/08/2024 NON REACTIVE  NON REACTIVE Final   Performed at [facility], [address], [place], [place] [address]  Admission on 05/06/2024, Discharged on 05/06/2024  Component Date Value Ref Range Status   WBC 05/06/2024 6.3  4.0 - 10.5 K/uL Final   RBC 05/06/2024 4.16 (L)  4.22 - 5.81 MIL/uL Final   Hemoglobin 05/06/2024 12.6 (L)  13.0 - 17.0 g/dL Final   HCT 87/91/7974 38.3 (L)  39.0 - 52.0 % Final   MCV 05/06/2024 92.1  80.0 - 100.0 fL Final   MCH 05/06/2024 30.3  26.0 - 34.0 pg Final   MCHC 05/06/2024 32.9  30.0 - 36.0 g/dL Final   RDW 87/91/7974 12.9  11.5 - 15.5 % Final   Platelets 05/06/2024 290  150 - 400 K/uL Final   nRBC 05/06/2024 0.0  0.0 - 0.2 % Final   Neutrophils Relative % 05/06/2024 48  % Final   Neutro Abs 05/06/2024 3.0  1.7 - 7.7 K/uL Final   Lymphocytes Relative 05/06/2024 42  % Final   Lymphs Abs 05/06/2024 2.6  0.7 - 4.0 K/uL Final   Monocytes Relative 05/06/2024 7  % Final   Monocytes Absolute 05/06/2024 0.4  0.1 - 1.0 K/uL Final   Eosinophils Relative 05/06/2024 2  % Final   Eosinophils Absolute 05/06/2024 0.2  0.0 - 0.5 K/uL Final   Basophils Relative 05/06/2024 1  % Final   Basophils Absolute 05/06/2024 0.0  0.0 - 0.1 K/uL Final   Immature Granulocytes 05/06/2024 0  % Final   Abs Immature Granulocytes 05/06/2024 0.01  0.00 - 0.07 K/uL Final   Performed at [facility], [address], [place], [place] [address]   Sodium 05/06/2024 139  135 - 145 mmol/L Final   Potassium 05/06/2024 4.1  3.5 - 5.1 mmol/L Final   Chloride 05/06/2024 100  98 - 111 mmol/L Final   CO2 05/06/2024 30  22 - 32 mmol/L Final   Glucose, Bld 05/06/2024 78  70 - 99 mg/dL Final   Glucose reference range applies only to samples taken after fasting for at least 8 hours.   BUN 05/06/2024 9  6 - 20 mg/dL Final   Creatinine, Ser 05/06/2024 0.63  0.61 - 1.24 mg/dL Final   Calcium 87/91/7974 9.3  8.9 - 10.3 mg/dL Final   Total Protein  05/06/2024 8.1  6.5 - 8.1 g/dL Final   Albumin 87/91/7974 4.1  3.5 - 5.0 g/dL Final   AST 87/91/7974 27  15 - 41 U/L Final   ALT 05/06/2024 32  0 - 44 U/L Final   Alkaline Phosphatase 05/06/2024 80  38 - 126 U/L Final   Total Bilirubin 05/06/2024 1.1  0.0 - 1.2 mg/dL Final   GFR, Estimated 05/06/2024 >60  >60 mL/min Final   Comment: (NOTE) Calculated using the CKD-EPI Creatinine Equation (2021)    Anion gap 05/06/2024 9  5 - 15 Final  Performed at [facility], [address], [place], [place] [address]   Magnesium  05/06/2024 1.9  1.7 - 2.4 mg/dL Final   Performed at [facility], [address] [place], [place] [address]   Alcohol, Ethyl (B) 05/06/2024 <15  <15 mg/dL Final   Comment: (NOTE) For medical purposes only. Performed at [facility], [address], [place], [place] [address]    TSH 05/06/2024 1.757  0.350 - 4.500 uIU/mL Final   Comment: Performed by a 3rd Generation assay with a functional sensitivity of <=0.01 uIU/mL. Performed at [facility], [address], [place], [place] [address]    POC Amphetamine UR 05/06/2024 None Detected  NONE DETECTED (Cut Off Level 1000 ng/mL) Final   POC Secobarbital (BAR) 05/06/2024 None Detected  NONE DETECTED (Cut Off Level 300 ng/mL) Final   POC Buprenorphine (BUP) 05/06/2024 None Detected  NONE DETECTED (Cut Off Level 10 ng/mL) Final   POC Oxazepam (BZO) 05/06/2024 None Detected  NONE DETECTED (Cut Off Level 300 ng/mL) Final   POC Cocaine UR 05/06/2024 None Detected  NONE DETECTED (Cut Off Level 300 ng/mL) Final   POC Methamphetamine UR 05/06/2024 None Detected  NONE DETECTED (Cut Off Level 1000 ng/mL) Final   POC Morphine 05/06/2024 None Detected  NONE DETECTED (Cut Off Level 300 ng/mL) Final   POC Methadone UR 05/06/2024 None Detected  NONE DETECTED (Cut Off Level 300 ng/mL) Final   POC Oxycodone  UR 05/06/2024 None Detected  NONE DETECTED (Cut Off Level 100 ng/mL) Final   POC Marijuana UR 05/06/2024  Positive (A)  NONE DETECTED (Cut Off Level 50 ng/mL) Final  Admission on 02/10/2024, Discharged on 02/10/2024  Component Date Value Ref Range Status   Sodium 02/10/2024 142  135 - 145 mmol/L Final   Potassium 02/10/2024 4.2  3.5 - 5.1 mmol/L Final   Chloride 02/10/2024 105  98 - 111 mmol/L Final   CO2 02/10/2024 27  22 - 32 mmol/L Final   Glucose, Bld 02/10/2024 77  70 - 99 mg/dL Final   Glucose reference range applies only to samples taken after fasting for at least 8 hours.   BUN 02/10/2024 12  6 - 20 mg/dL Final   Creatinine, Ser 02/10/2024 0.88  0.61 - 1.24 mg/dL Final   Calcium 90/86/7974 10.0  8.9 - 10.3 mg/dL Final   Total Protein 90/86/7974 7.1  6.5 - 8.1 g/dL Final   Albumin 90/86/7974 4.5  3.5 - 5.0 g/dL Final   AST 90/86/7974 24  15 - 41 U/L Final   ALT 02/10/2024 12  0 - 44 U/L Final   Alkaline Phosphatase 02/10/2024 99  38 - 126 U/L Final   Total Bilirubin 02/10/2024 0.7  0.0 - 1.2 mg/dL Final   GFR, Estimated 02/10/2024 >60  >60 mL/min Final   Comment: (NOTE) Calculated using the CKD-EPI Creatinine Equation (2021)    Anion gap 02/10/2024 11  5 - 15 Final   Performed at [facility], [address], [place], [place] [address]   Glucose-Capillary 02/10/2024 74  70 - 99 mg/dL Final   Glucose reference range applies only to samples taken after fasting for at least 8 hours.   WBC 02/10/2024 6.1  4.0 - 10.5 K/uL Final   RBC 02/10/2024 4.25  4.22 - 5.81 MIL/uL Final   Hemoglobin 02/10/2024 12.7 (L)  13.0 - 17.0 g/dL Final   HCT 90/86/7974 41.7  39.0 - 52.0 % Final   MCV 02/10/2024 98.1  80.0 - 100.0 fL Final   MCH 02/10/2024 29.9  26.0 - 34.0 pg Final   MCHC 02/10/2024 30.5  30.0 - 36.0 g/dL Final   RDW 90/86/7974 12.3  11.5 - 15.5 % Final   Platelets 02/10/2024 242  150 - 400 K/uL Final   nRBC 02/10/2024 0.0  0.0 - 0.2 % Final   Performed at [facility], [address] [place], [place] [address]    Allergies: Fava beans, Sulfa  antibiotics, Fish allergy, and Justicia adhatoda (malabar nut tree) [justicia adhatoda]  Medications:  PTA Medications  Medication Sig   nicotine  (NICODERM CQ  - DOSED IN MG/24 HOURS) 14 mg/24hr patch Place 1 patch (14 mg total) onto the skin daily. (May buy from over the counter): For smoking cessation.   nicotine  polacrilex (NICORETTE ) 2 MG gum Take 1 each (2 mg total) by mouth as needed. (May buy from over the counter): For smoking cessation.   albuterol  (VENTOLIN  HFA) 108 (90 Base) MCG/ACT inhaler Inhale 1-2 puffs into the lungs every 4 (four) hours as needed for wheezing or shortness of breath.   levETIRAcetam  (KEPPRA  XR) 500 MG 24 hr tablet Take 1 tablet (500 mg total) by mouth daily. For seizures.   sertraline  (ZOLOFT ) 50 MG tablet Take 1 tablet (50 mg total) by mouth daily. For depression.   traZODone  (DESYREL ) 50 MG tablet Take 1 tablet (50 mg total) by mouth at bedtime as needed for sleep.   hydrOXYzine  (ATARAX ) 25 MG tablet Take 1 tablet (25 mg total) by mouth 3 (three) times daily as needed for anxiety.      Medical Decision Making  Patient is a 23 year old male with a history of PTSD, MDD, GAD, Cocaine and marijuana use having worsening depression/SI in the setting of psychosocial stressors that include interpersonal conflicts with his girlfriend, homelessness. Consistent marijuana use has been playing a role as well, not used any other substances, UDS pending. Trauma/unstable housing environment growing up have likely resulted in his presentation. He has poor coping skills and limited insights into his condition. 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 He is currently homeless,  and  unable to safety plan, will admit to observation overnight. Will restart his medications.     Recommendations  Based on my evaluation the patient does not appear to have an emergency medical condition. - Restart Zoloft  50 mg daily - Restart Trazodone  50 mg PRN at bedtime for sleep - Restart Keppra  500 mg daily for seizures - Restart Hydroxyzine  PRN 25 mg TID  - Admit to obs  Hasaan Radde, MD 05/24/2024  12:21 PM

## 2024-05-24 NOTE — Congregational Nurse Program (Addendum)
" °  Dept: [phone]   Congregational Nurse Program Note  Date of Encounter: 05/24/2024  Past Medical History: Past Medical History:  Diagnosis Date   ADHD    Leg fracture, left    Leg fracture, right     Encounter Details:  Community Questionnaire - 05/24/24 1010       Questionnaire   Ask client: Do you give verbal consent for me to treat you today? Yes    Student Assistance N/A    Location Patient Served  [facility]    Encounter Setting CN site    Population Status Unknown    Insurance Medicaid    Insurance/Financial Assistance Referral N/A    Medication N/A    Medical Provider Yes    Screening Referrals Made N/A    Medical Referrals Made N/A    Medical Appointment Completed N/A    CNP Interventions Advocate/Support;Counsel;Spiritual Care;Navigate Healthcare System    Screenings CN Performed N/A    ED Visit Averted Yes    Life-Saving Intervention Made Yes          Client presented to RN seeking emotional and moral support related to recent personal relationship challenges and disagreement with the mother of his child. Client expressed that he is not interested in engaging with a mental health therapist at this time and instead desired a supportive conversation and spiritual care from RN.   RN provided therapeutic communication, empathetic listening, and spiritual encouragement during the visit. Client asked RN for relationship advice and how to cope when he becomes angry. RN suggested walking away from situation to allow overwhelming emotions to settle down. RN offered ministry of presence and compassionate support while encouraging client to consider professional mental health services in the future. Client denies harm to self or others.   No acute safety concerns noted. RN will continue to offer spiritual and emotional support within the boundaries of RN role.  Additional note 11:21 Client reported back to RN office and has decided to go to [facility]. RN arranged  transportation for client and stayed with client until he was picked up.     "

## 2024-05-24 NOTE — ED Notes (Signed)
Pt A& O x 4, sleeping at present, no distress noted, calm & cooperative, monitoring for safety. 

## 2024-05-24 NOTE — ED Notes (Signed)
 Pt sleeping at present, no distress noted, monitoring for safety.
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
## 2024-05-24 NOTE — ED Notes (Signed)
 Pt is currently sleeping, no distress noted, environmental check complete, will continue to monitor patient for safety.

## 2024-05-25 LAB — HEMOGLOBIN A1C
Hgb A1c MFr Bld: 4.6 % — ABNORMAL LOW (ref 4.8–5.6)
Mean Plasma Glucose: 85.32 mg/dL

## 2024-05-25 MED ORDER — HYDROXYZINE HCL 25 MG PO TABS: 25.0000 mg | ORAL_TABLET | Freq: Three times a day (TID) | ORAL | 0 refills | Status: DC | PRN

## 2024-05-25 MED ORDER — LEVETIRACETAM ER 500 MG PO TB24: 500.0000 mg | ORAL_TABLET | Freq: Every day | ORAL | 0 refills | Status: DC

## 2024-05-25 MED ORDER — SERTRALINE HCL 50 MG PO TABS: 50.0000 mg | ORAL_TABLET | Freq: Every day | ORAL | 0 refills | Status: DC

## 2024-05-25 MED ORDER — TRAZODONE HCL 50 MG PO TABS: 50.0000 mg | ORAL_TABLET | Freq: Every evening | ORAL | 0 refills | Status: DC | PRN

## 2024-05-25 NOTE — ED Notes (Signed)
 Pt sleeping at present, no distress noted.  Monitoring for safety.

## 2024-05-25 NOTE — ED Provider Notes (Signed)
 FBC/OBS ASAP Discharge Summary  Date and Time: 05/25/2024 8:31 AM  Name: [facility]  MRN:  [id number]   Discharge Diagnoses:  Final diagnoses:  MDD (major depressive disorder), single episode, severe (HCC)  Moderate tetrahydrocannabinol (THC) dependence (HCC)  Homelessness    Subjective: The patient was seen at his bedside today. He was alert and oriented and denied any active or passive SI/HI/AVH. When asked about his mood, he raised his thumbs up.He reported good sleep and good appetite, denied any physical concerns. Reported that he would be going back to the [facility] and will be needing a bus pass. Reported that his girlfriend has been giving him all his medications. He denied feeling depressed or anxious. He denied any acute concerns and there were no significant events overnight. Reported that he will be needing paper prescriptions following discharge. No safety concerns voiced.  Stay Summary: Robert Silva is a 23 year old male patient with a history of MDD, GAD, PTSD, cocaine use disorder, marijuana use disorder and seizures that presented to the [facility] for worsening depression and suicidal ideation. Patient reported that he has been having depression due to arguments with my girlfriend, reported that This is the first christmas I spent without my kids. He endorsed Suicidal ideation with a plan to overdose on all my medications. Stated that he has been compliant on all the medications including trazodone , sertraline , hydroxyzine  and Keppra .  Reported that he would take all his medications and overdose himself, denied any previous overdose attempts but reported cutting behaviors.  He had a superficial cut on his right arm I did it yesterday.  He had an argument she thought I was cheating, and she was locked up I was in relationship with her another woman. He reported symptoms of decreased interest in daily activities, decreased energy, reduced appetite and passive suicidal ideations however denied  any issues with sleep okay when asked about how his sleep was.  He also reported, frequent flashbacks about traumatic events growing up my mom and my dad had bad relationship, and my past relationships .  He denied any nightmares. When asked about anxiety he stated no anxiety , he denied any homicidal ideations, denied any auditory or visual hallucinations. Reported that he has been having a lot of relationship issues, impulsive behaviors including drugs in the past, fear of abandonment, identity issues, mood fluctuations throughout the day, feeling chronically empty, cutting behaviors since age of 47 and making efforts to avoid abandonment with his relationships. Reported that he has been smoking marijuana every day 1 blunt, smoking ADHD, denied using any other substances including alcohol.  Reported that he used cocaine 2 months ago however quit since then. We discussed about admission to the observation unit and continuing on the medications.  He denied any side effects or any new physical concerns.  Total Time spent with patient: 30 minutes  Past Psychiatric History: ADHD, Adjustment disorder, MDD, GAD  Past Medical History:  Past Medical History:  Diagnosis Date   ADHD    Leg fracture, left    Leg fracture, right     Family History: Mother has bipolar disorder, MDD and Father has Schizophrenia  Family Psychiatric History: Mother has bipolar disorder, MDD and Father has Schizophrenia  Social History: He is currently homeless, resides at [facility]. Is in [facility] for assessments  Tobacco Cessation:  A prescription for an FDA-approved tobacco cessation medication was offered at discharge and the patient refused  Current Medications:  Current Facility-Administered Medications  Medication Dose Route Frequency Provider  Last Rate Last Admin   acetaminophen  (TYLENOL ) tablet 650 mg  650 mg Oral Q6H PRN Detrich Rakestraw, MD       alum & mag hydroxide-simeth (MAALOX/MYLANTA) 200-200-20 MG/5ML suspension  30 mL  30 mL Oral Q4H PRN Cordelle Dahmen, MD       haloperidol  (HALDOL ) tablet 5 mg  5 mg Oral TID PRN Khaliq Turay, MD       And   diphenhydrAMINE  (BENADRYL ) capsule 50 mg  50 mg Oral TID PRN Hellon Vaccarella, MD       haloperidol  lactate (HALDOL ) injection 5 mg  5 mg Intramuscular TID PRN Jame Morrell, MD       And   diphenhydrAMINE  (BENADRYL ) injection 50 mg  50 mg Intramuscular TID PRN Helen Cuff, MD       And   LORazepam  (ATIVAN ) injection 2 mg  2 mg Intramuscular TID PRN Ala Capri, MD       haloperidol  lactate (HALDOL ) injection 10 mg  10 mg Intramuscular TID PRN Terissa Haffey, MD       And   diphenhydrAMINE  (BENADRYL ) injection 50 mg  50 mg Intramuscular TID PRN Imunique Samad, MD       And   LORazepam  (ATIVAN ) injection 2 mg  2 mg Intramuscular TID PRN Larry Knipp, MD       hydrOXYzine  (ATARAX ) tablet 25 mg  25 mg Oral TID PRN Uzma Hellmer, MD       levETIRAcetam  (KEPPRA  XR) 24 hr tablet 500 mg  500 mg Oral Daily Roman Sandall, MD   500 mg at 05/24/24 1347   magnesium  hydroxide (MILK OF MAGNESIA) suspension 30 mL  30 mL Oral Daily PRN Mcarthur Ivins, MD       sertraline  (ZOLOFT ) tablet 50 mg  50 mg Oral Daily Olaoluwa Grieder, MD   50 mg at 05/24/24 1347   traZODone  (DESYREL ) tablet 50 mg  50 mg Oral QHS PRN Venna Berberich, MD       Current Outpatient Medications  Medication Sig Dispense Refill   hydrOXYzine  (ATARAX ) 25 MG tablet Take 1 tablet (25 mg total) by mouth 3 (three) times daily as needed for anxiety. 75 tablet 0   levETIRAcetam  (KEPPRA  XR) 500 MG 24 hr tablet Take 1 tablet (500 mg total) by mouth daily. For seizures. 30 tablet 0   sertraline  (ZOLOFT ) 50 MG tablet Take 1 tablet (50 mg total) by mouth daily. For depression. 30 tablet 0   traZODone  (DESYREL ) 50 MG tablet Take 1 tablet (50 mg total) by mouth at bedtime as needed for sleep. 30 tablet 0   albuterol  (VENTOLIN  HFA) 108 (90 Base) MCG/ACT inhaler Inhale 1-2 puffs into the lungs every 4 (four) hours as needed for  wheezing or shortness of breath. (Patient not taking: Reported on 05/24/2024) 18 g 0   nicotine  (NICODERM CQ  - DOSED IN MG/24 HOURS) 14 mg/24hr patch Place 1 patch (14 mg total) onto the skin daily. (May buy from over the counter): For smoking cessation. (Patient not taking: Reported on 05/24/2024)     nicotine  polacrilex (NICORETTE ) 2 MG gum Take 1 each (2 mg total) by mouth as needed. (May buy from over the counter): For smoking cessation. (Patient not taking: Reported on 05/24/2024)      PTA Medications:  Facility Ordered Medications  Medication   acetaminophen  (TYLENOL ) tablet 650 mg   alum & mag hydroxide-simeth (MAALOX/MYLANTA) 200-200-20 MG/5ML suspension 30 mL   magnesium  hydroxide (MILK OF MAGNESIA) suspension 30 mL   haloperidol  (HALDOL ) tablet  5 mg   And   diphenhydrAMINE  (BENADRYL ) capsule 50 mg   haloperidol  lactate (HALDOL ) injection 5 mg   And   diphenhydrAMINE  (BENADRYL ) injection 50 mg   And   LORazepam  (ATIVAN ) injection 2 mg   haloperidol  lactate (HALDOL ) injection 10 mg   And   diphenhydrAMINE  (BENADRYL ) injection 50 mg   And   LORazepam  (ATIVAN ) injection 2 mg   traZODone  (DESYREL ) tablet 50 mg   hydrOXYzine  (ATARAX ) tablet 25 mg   levETIRAcetam  (KEPPRA  XR) 24 hr tablet 500 mg   sertraline  (ZOLOFT ) tablet 50 mg   PTA Medications  Medication Sig   levETIRAcetam  (KEPPRA  XR) 500 MG 24 hr tablet Take 1 tablet (500 mg total) by mouth daily. For seizures.   sertraline  (ZOLOFT ) 50 MG tablet Take 1 tablet (50 mg total) by mouth daily. For depression.   traZODone  (DESYREL ) 50 MG tablet Take 1 tablet (50 mg total) by mouth at bedtime as needed for sleep.   hydrOXYzine  (ATARAX ) 25 MG tablet Take 1 tablet (25 mg total) by mouth 3 (three) times daily as needed for anxiety.   nicotine  (NICODERM CQ  - DOSED IN MG/24 HOURS) 14 mg/24hr patch Place 1 patch (14 mg total) onto the skin daily. (May buy from over the counter): For smoking cessation. (Patient not taking: Reported on  05/24/2024)   nicotine  polacrilex (NICORETTE ) 2 MG gum Take 1 each (2 mg total) by mouth as needed. (May buy from over the counter): For smoking cessation. (Patient not taking: Reported on 05/24/2024)   albuterol  (VENTOLIN  HFA) 108 (90 Base) MCG/ACT inhaler Inhale 1-2 puffs into the lungs every 4 (four) hours as needed for wheezing or shortness of breath. (Patient not taking: Reported on 05/24/2024)       07/13/2023   11:17 AM  Depression screen PHQ 2/9  Decreased Interest 2  Down, Depressed, Hopeless 3  PHQ - 2 Score 5  Altered sleeping 2  Tired, decreased energy 2  Change in appetite 2  Feeling bad or failure about yourself  3  Trouble concentrating 3  Moving slowly or fidgety/restless 1  Suicidal thoughts 3  PHQ-9 Score 21   Difficult doing work/chores Somewhat difficult     Data saved with a previous flowsheet row definition    Flowsheet Row ED from 05/24/2024 in [facility] Most recent reading at 05/24/2024  2:10 PM Ad[facility] (Discharged) [facility] Most recent reading at 05/06/2024  5:00 PM ED from 05/06/2024 in [facility] Most recent reading at 05/06/2024  2:21 PM  C-SSRS RISK CATEGORY High Risk High Risk High Risk    Musculoskeletal  Strength & Muscle Tone: within normal limits Gait & Station: normal Patient leans: N/A  Psychiatric Specialty Exam  Presentation  General Appearance:  Casual; Fairly Groomed  Eye Contact: Fair  Speech: Clear and Coherent  Speech Volume: Normal  Handedness: Right   Mood and Affect  Mood: Depressed; Worthless  Affect: Depressed   Thought Process  Thought Processes: Linear  Descriptions of Associations:Intact  Orientation:Full (Time, Place and Person)  Thought Content:Logical  Diagnosis of Schizophrenia or Schizoaffective disorder in past: No    Hallucinations:Hallucinations: None  Ideas of  Reference:None  Suicidal Thoughts:Suicidal Thoughts: Yes, Passive SI Active Intent and/or Plan: With Intent; Without Means to Carry Out; Without Access to Means SI Passive Intent and/or Plan: With Intent; Without Means to Carry Out; Without Access to Means  Homicidal Thoughts:Homicidal Thoughts: No  Sensorium  Memory: Immediate Good  Judgment: Poor  Insight: Poor   Executive Functions  Concentration: Fair  Attention Span: Fair  Recall: [employer] of Knowledge: Fair  Language: Fair   Psychomotor Activity  Psychomotor Activity: Psychomotor Activity: Normal   Assets  Assets: Manufacturing Systems Engineer; Desire for Improvement   Sleep  Sleep: Sleep: Fair  No Safety Checks orders active in given range  Nutritional Assessment (For OBS and FBC admissions only) Has the patient had a weight loss or gain of 10 pounds or more in the last 3 months?: No Has the patient had a decrease in food intake/or appetite?: No Does the patient have dental problems?: No Does the patient have eating habits or behaviors that may be indicators of an eating disorder including binging or inducing vomiting?: No Has the patient recently lost weight without trying?: 0 Has the patient been eating poorly because of a decreased appetite?: 0 Malnutrition Screening Tool Score: 0    Physical Exam  Physical Exam Vitals and nursing note reviewed.  Constitutional:      General: He is not in acute distress.    Appearance: He is well-developed.  HENT:     Head: Normocephalic and atraumatic.  Eyes:     Conjunctiva/sclera: Conjunctivae normal.  Cardiovascular:     Rate and Rhythm: Normal rate and regular rhythm.     Heart sounds: No murmur heard. Pulmonary:     Effort: Pulmonary effort is normal. No respiratory distress.     Breath sounds: Normal breath sounds.  Abdominal:     Palpations: Abdomen is soft.     Tenderness: There is no abdominal tenderness.  Musculoskeletal:         General: No swelling.     Cervical back: Neck supple.  Skin:    General: Skin is warm and dry.     Capillary Refill: Capillary refill takes less than 2 seconds.  Neurological:     Mental Status: He is alert.  Psychiatric:        Mood and Affect: Mood normal.    Review of Systems  Constitutional:  Negative for chills, fever, malaise/fatigue and weight loss.  HENT:  Negative for ear pain, hearing loss and tinnitus.   Eyes:  Negative for blurred vision, double vision and photophobia.  Respiratory:  Negative for cough and hemoptysis.   Cardiovascular:  Negative for chest pain, palpitations and orthopnea.  Gastrointestinal:  Negative for abdominal pain, heartburn, nausea and vomiting.  Genitourinary:  Negative for dysuria, frequency and urgency.  Musculoskeletal:  Negative for myalgias and neck pain.  Skin:  Negative for rash.  Neurological:  Negative for dizziness, tingling, tremors, weakness and headaches.  Endo/Heme/Allergies:  Negative for environmental allergies. Does not bruise/bleed easily.  Psychiatric/Behavioral:  Positive for substance abuse. Negative for depression, hallucinations and suicidal ideas. The patient is not nervous/anxious and does not have insomnia.    Blood pressure 130/67, pulse 67, temperature 98.3 F (36.8 C), temperature source Oral, resp. rate 19, SpO2 99%. There is no height or weight on file to calculate BMI.  Demographic Factors:  Male, Adolescent or young adult, Low socioeconomic status, and Unemployed  Loss Factors: Financial problems/change in socioeconomic status  Historical Factors: Prior suicide attempts, Family history of mental illness or substance abuse, and Impulsivity  Risk Reduction Factors:   NA  Continued Clinical Symptoms:  Personality Disorders:   Cluster B More than one psychiatric diagnosis Previous Psychiatric Diagnoses and Treatments  Cognitive Features That Contribute To Risk:  None  Suicide Risk:  Moderate:  Frequent  suicidal ideation with limited intensity, and duration, some specificity in terms of plans, no associated intent, good self-control, limited dysphoria/symptomatology, some risk factors present, and identifiable protective factors, including available and accessible social support.  Plan Of Care/Follow-up recommendations:  Activity:  As tolerated Diet:  Regular  Disposition: IRC  Jacqualin Sells, MD 05/25/2024, 8:31 AM

## 2024-05-25 NOTE — Discharge Instructions (Signed)

## 2024-05-27 ENCOUNTER — Other Ambulatory Visit: Payer: Self-pay

## 2024-05-27 NOTE — Congregational Nurse Program (Signed)
" °  Dept: [phone]   Congregational Nurse Program Note  Date of Encounter: 05/27/2024  Past Medical History: Past Medical History:  Diagnosis Date   ADHD    Leg fracture, left    Leg fracture, right     Encounter Details:  Community Questionnaire - 05/27/24 1110       Questionnaire   Ask client: Do you give verbal consent for me to treat you today? Yes    Student Assistance N/A    Location Patient Served  [facility]    Encounter Setting CN site    Population Status Unknown    Insurance Medicaid    Insurance/Financial Assistance Referral N/A    Medication N/A    Medical Provider Yes    Screening Referrals Made N/A    Medical Referrals Made Non-[facility]    Medical Appointment Completed Non-[facility]    CNP Interventions Advocate/Support;Counsel;Spiritual [employer] [facility];Case Management    Screenings CN Performed N/A    ED Visit Averted Yes    Life-Saving Intervention Made Yes         Client to RN office for emotional and spiritual support. Client wanted to decompress from the weekend and seeking support. Client is struggling with male relationships. Client does seem open to going to [facility] for therapy. States he stopped by there and no one was behind desk. He plans on going back later today. Client denies harm to self or others. RN provided client with spiritual care including therapeutic communication, presence, active listening and encouragement. Client is appreciative of RN support.   1300 update Client states he went back to [facility] and made appointment for services.   "

## 2024-05-29 NOTE — Congregational Nurse Program (Signed)
" °  Dept: [phone]   Congregational Nurse Program Note  Date of Encounter: 05/29/2024  Past Medical History: Past Medical History:  Diagnosis Date   ADHD    Leg fracture, left    Leg fracture, right     Encounter Details:  Community Questionnaire - 05/29/24 1300       Questionnaire   Ask client: Do you give verbal consent for me to treat you today? Yes    Student Assistance N/A    Location Patient Served  [facility]    Encounter Setting CN site    Population Status Unhoused    Insurance Medicaid    Insurance/Financial Assistance Referral N/A    Medication Have Medication Insecurities;Patient Medications Reviewed;CCNP Provided Medication Assistance    Medical Provider Yes    Medical Referrals Made NA    Medical Appointment Completed Non-[facility]    Screenings CN Performed (remember to also record results) NA    CNP Interventions Advocate/Support;Other Education;Health Counseling    ED Visit Averted Yes      Questionnaire   Life-Saving Intervention Made Yes          Client presented to RN office for daily processing of life events. Client is also complaining of stuffy nose, client denies any other symptoms. RN took temperature 99.5. Encouraged fluids, rest and hot shower. Client states he becomes irritated when not feeling well. RN able to do relaxation tecniqu with client. RN provided spiritual care which included therapeutic communication, presence, active listening and encouragement. Will continue to follow for any additional needs.  "

## 2024-06-03 ENCOUNTER — Other Ambulatory Visit: Payer: Self-pay

## 2024-06-04 ENCOUNTER — Other Ambulatory Visit: Payer: Self-pay

## 2024-06-04 DIAGNOSIS — G40909 Epilepsy, unspecified, not intractable, without status epilepticus: Secondary | ICD-10-CM

## 2024-06-04 DIAGNOSIS — F332 Major depressive disorder, recurrent severe without psychotic features: Secondary | ICD-10-CM

## 2024-06-04 MED ORDER — TRAZODONE HCL 50 MG PO TABS
50.0000 mg | ORAL_TABLET | Freq: Every evening | ORAL | 0 refills | Status: AC | PRN
  Filled 2024-06-04 (×2): qty 30, 30d supply, fill #0

## 2024-06-04 MED ORDER — SERTRALINE HCL 50 MG PO TABS
50.0000 mg | ORAL_TABLET | Freq: Every day | ORAL | 0 refills | Status: DC
  Filled 2024-06-04 (×2): qty 30, 30d supply, fill #0

## 2024-06-04 MED ORDER — LEVETIRACETAM ER 500 MG PO TB24
500.0000 mg | ORAL_TABLET | Freq: Every day | ORAL | 0 refills | Status: AC
  Filled 2024-06-04 (×2): qty 30, 30d supply, fill #0

## 2024-06-04 MED ORDER — HYDROXYZINE HCL 25 MG PO TABS
25.0000 mg | ORAL_TABLET | Freq: Three times a day (TID) | ORAL | 0 refills | Status: AC | PRN
  Filled 2024-06-04 (×2): qty 75, 25d supply, fill #0

## 2024-06-04 NOTE — Progress Notes (Signed)
 He came by for Rfs. I sent the meds he requested to [facility]. CCN Nyla) to pick up for him

## 2024-06-05 ENCOUNTER — Other Ambulatory Visit: Payer: Self-pay

## 2024-06-05 ENCOUNTER — Ambulatory Visit (HOSPITAL_COMMUNITY)

## 2024-06-10 NOTE — Congregational Nurse Program (Signed)
" °  Dept: [phone]   Congregational Nurse Program Note  Date of Encounter: 06/10/2024  Past Medical History: Past Medical History:  Diagnosis Date   ADHD    Leg fracture, left    Leg fracture, right     Encounter Details:  Community Questionnaire - 06/10/24 0945       Questionnaire   Ask client: Do you give verbal consent for me to treat you today? Yes    Student Assistance N/A    Location Patient Served  [facility]    Encounter Setting CN site    Population Status Unhoused    Insurance Medicaid    Insurance/Financial Assistance Referral N/A    Medication Have Medication Insecurities;Patient Medications Reviewed;CCNP Provided Medication Assistance    Medical Provider Yes    Medical Referrals Made NA    Medical Appointment Completed N/A    Screenings CN Performed (remember to also record results) NA    CNP Interventions Advocate/Support;Other Education;Health Counseling    ED Visit Averted N/A      Questionnaire   Life-Saving Intervention Made N/A          Client to RN office for mental health needs. Client is emotionally overwhelmed and seeking a safe space to process current life stressors. Client disclosed several upsetting events that have recently happened and he expressed frustration, anger, and a desire to make healthier decisions, while also feeling uncertain about how to move forward. RN provided a supportive, nonjudgmental environment utilizing empathetic and active listening as well as therapeutic communication. Client was encouraged to consider mental health counseling as an additional support. As client told me he overslept and missed appointments RN scheduled. RN gave client to reschedule appointment now that he has phone. Visit focused on emotional support and creating space for client to reflect openly on internal conflict and decision making. RN gave client meds that were picked up for him at pharmacy. No safety concerns noted at this time.   "

## 2024-06-25 NOTE — Congregational Nurse Program (Signed)
" °  Dept: [phone]   Congregational Nurse Program Note  Date of Encounter: 06/25/2024  Past Medical History: Past Medical History:  Diagnosis Date   ADHD    Leg fracture, left    Leg fracture, right     Encounter Details:  Community Questionnaire - 06/25/24 1040       Questionnaire   Ask client: Do you give verbal consent for me to treat you today? Yes    Student Assistance N/A    Location Patient Served  [facility]    Encounter Setting CN site    Population Status Unhoused    Insurance Medicaid    Insurance/Financial Assistance Referral N/A    Medication Have Medication Insecurities;Patient Medications Reviewed    Medical Provider Yes    Medical Referrals Made NA    Medical Appointment Completed Non-[facility]   per clients report   Screenings CN Performed (remember to also record results) NA    CN Interventions Advocate/Support;Other Education;Health Counseling;Case Management    ED Visit Averted N/A      Questionnaire   Life-Saving Intervention Made N/A         Client presented to the RN office requesting emotional and spiritual support. Client reported experiencing cravings to use substances but expressed a desire to remain clean. He endorsed ongoing feelings of irritability toward others. Client stated he has been staying with the mother of his child. He reported taking his medications as prescribed and stated that he feels the medications are helping. RN asked the client if he was currently seeing a therapist. Client stated he has been seeing a therapist regularly and has an appointment scheduled for tomorrow. No other acute needs at this time. "

## 2024-06-28 NOTE — Congregational Nurse Program (Signed)
" °  Dept: [phone]   Congregational Nurse Program Note  Date of Encounter: 06/28/2024  Past Medical History: Past Medical History:  Diagnosis Date   ADHD    Leg fracture, left    Leg fracture, right     Encounter Details:  Community Questionnaire - 06/27/24 1000       Questionnaire   Ask client: Do you give verbal consent for me to treat you today? Yes    Student Assistance N/A    Location Patient Served  [facility]    Encounter Setting CN site    Population Status Unhoused    Insurance Medicaid    Insurance/Financial Assistance Referral N/A    Medication Have Medication Insecurities;Patient Medications Reviewed;CCNP Provided Medication Assistance    Medical Provider Yes    Medical Referrals Made NA    Medical Appointment Completed N/A    Screenings CN Performed (remember to also record results) NA    CN Interventions Advocate/Support;Other Education;Health Counseling;Case Management    ED Visit Averted N/A      Questionnaire   Life-Saving Intervention Made Yes         Client presented to RN office seeking a safe space to verbalize events from the past few days. Client appeared emotionally distressed regarding an incident involving the mother of his child. Client reported that she told their son that the client does not love him or his mother. Client stated this interaction has left him feeling upset and emotional.  Client reported he has a therapy appointment scheduled for tomorrow. RN provided emotional support and assisted the client in focusing on factors within his control, as well as grounding and calming techniques. Client appeared receptive and willing to attempt these strategies. RN observed the client become more relaxed and noted a decrease in voice volume. Client reported the interventions were helpful. Client denied any additional acute needs at this time. Client denied thoughts of harm to self or others. "

## 2024-07-02 ENCOUNTER — Other Ambulatory Visit: Payer: Self-pay

## 2024-07-02 ENCOUNTER — Other Ambulatory Visit: Payer: Self-pay | Admitting: *Deleted

## 2024-07-02 DIAGNOSIS — F341 Dysthymic disorder: Secondary | ICD-10-CM

## 2024-07-02 DIAGNOSIS — H9201 Otalgia, right ear: Secondary | ICD-10-CM

## 2024-07-02 MED ORDER — SERTRALINE HCL 50 MG PO TABS
75.0000 mg | ORAL_TABLET | Freq: Every day | ORAL | 2 refills | Status: AC
  Filled 2024-07-02: qty 45, 30d supply, fill #0

## 2024-07-02 MED ORDER — NEOMYCIN-POLYMYXIN-HC 3.5-10000-1 OT SOLN
3.0000 [drp] | Freq: Three times a day (TID) | OTIC | 0 refills | Status: AC
  Filled 2024-07-02: qty 10, 10d supply, fill #0

## 2024-08-19 ENCOUNTER — Ambulatory Visit (HOSPITAL_COMMUNITY): Admitting: Student in an Organized Health Care Education/Training Program
